# Patient Record
Sex: Male | Born: 1965 | Race: White | Hispanic: No | Marital: Single | Smoking: Never smoker
Health system: Southern US, Community
[De-identification: ages and names within clinical notes are randomized; demographics above are authoritative.]

## PROBLEM LIST (undated history)

## (undated) DIAGNOSIS — I219 Acute myocardial infarction, unspecified: Secondary | ICD-10-CM

## (undated) DIAGNOSIS — G43909 Migraine, unspecified, not intractable, without status migrainosus: Secondary | ICD-10-CM

## (undated) DIAGNOSIS — I10 Essential (primary) hypertension: Secondary | ICD-10-CM

## (undated) HISTORY — PX: CARDIAC CATHETERIZATION: SHX172

---

## 2015-03-04 ENCOUNTER — Encounter (HOSPITAL_COMMUNITY): Payer: Self-pay | Admitting: Emergency Medicine

## 2015-03-04 ENCOUNTER — Inpatient Hospital Stay (HOSPITAL_COMMUNITY)
Admission: EM | Admit: 2015-03-04 | Discharge: 2015-03-07 | DRG: 247 | Disposition: A | Discharge status: Home / Self Care | Payer: 59 | Attending: Cardiovascular Disease | Admitting: Cardiovascular Disease

## 2015-03-04 ENCOUNTER — Encounter (HOSPITAL_COMMUNITY)
Admission: EM | Disposition: A | Discharge status: Home / Self Care | Payer: Self-pay | Source: Home / Self Care | Attending: Cardiovascular Disease

## 2015-03-04 ENCOUNTER — Ambulatory Visit (HOSPITAL_COMMUNITY): Admit: 2015-03-04 | Payer: Self-pay | Admitting: Cardiovascular Disease

## 2015-03-04 DIAGNOSIS — E785 Hyperlipidemia, unspecified: Secondary | ICD-10-CM | POA: Diagnosis present

## 2015-03-04 DIAGNOSIS — Z955 Presence of coronary angioplasty implant and graft: Secondary | ICD-10-CM

## 2015-03-04 DIAGNOSIS — I10 Essential (primary) hypertension: Secondary | ICD-10-CM | POA: Diagnosis present

## 2015-03-04 DIAGNOSIS — R001 Bradycardia, unspecified: Secondary | ICD-10-CM | POA: Diagnosis not present

## 2015-03-04 DIAGNOSIS — I959 Hypotension, unspecified: Secondary | ICD-10-CM | POA: Diagnosis not present

## 2015-03-04 DIAGNOSIS — I25110 Atherosclerotic heart disease of native coronary artery with unstable angina pectoris: Secondary | ICD-10-CM | POA: Diagnosis present

## 2015-03-04 DIAGNOSIS — I213 ST elevation (STEMI) myocardial infarction of unspecified site: Secondary | ICD-10-CM

## 2015-03-04 DIAGNOSIS — I2119 ST elevation (STEMI) myocardial infarction involving other coronary artery of inferior wall: Secondary | ICD-10-CM | POA: Diagnosis present

## 2015-03-04 DIAGNOSIS — I219 Acute myocardial infarction, unspecified: Secondary | ICD-10-CM

## 2015-03-04 DIAGNOSIS — I2111 ST elevation (STEMI) myocardial infarction involving right coronary artery: Principal | ICD-10-CM | POA: Diagnosis present

## 2015-03-04 DIAGNOSIS — R079 Chest pain, unspecified: Secondary | ICD-10-CM | POA: Diagnosis present

## 2015-03-04 DIAGNOSIS — I2510 Atherosclerotic heart disease of native coronary artery without angina pectoris: Secondary | ICD-10-CM | POA: Diagnosis not present

## 2015-03-04 DIAGNOSIS — I2582 Chronic total occlusion of coronary artery: Secondary | ICD-10-CM | POA: Diagnosis present

## 2015-03-04 DIAGNOSIS — I200 Unstable angina: Secondary | ICD-10-CM | POA: Diagnosis present

## 2015-03-04 HISTORY — PX: CARDIAC CATHETERIZATION: SHX172

## 2015-03-04 HISTORY — DX: Acute myocardial infarction, unspecified: I21.9

## 2015-03-04 HISTORY — DX: Essential (primary) hypertension: I10

## 2015-03-04 HISTORY — DX: Migraine, unspecified, not intractable, without status migrainosus: G43.909

## 2015-03-04 LAB — I-STAT CHEM 8, ED
BUN: 15 mg/dL (ref 6–20)
Calcium, Ion: 1.07 mmol/L — ABNORMAL LOW (ref 1.12–1.23)
Chloride: 101 mmol/L (ref 101–111)
Creatinine, Ser: 1.1 mg/dL (ref 0.61–1.24)
Glucose, Bld: 134 mg/dL — ABNORMAL HIGH (ref 65–99)
HCT: 45 % (ref 39.0–52.0)
Hemoglobin: 15.3 g/dL (ref 13.0–17.0)
Potassium: 3.8 mmol/L (ref 3.5–5.1)
Sodium: 137 mmol/L (ref 135–145)
TCO2: 21 mmol/L (ref 0–100)

## 2015-03-04 LAB — CBC
HCT: 40.4 % (ref 39.0–52.0)
Hemoglobin: 13.5 g/dL (ref 13.0–17.0)
MCH: 27.7 pg (ref 26.0–34.0)
MCHC: 33.4 g/dL (ref 30.0–36.0)
MCV: 83 fL (ref 78.0–100.0)
Platelets: 439 10*3/uL — ABNORMAL HIGH (ref 150–400)
RBC: 4.87 MIL/uL (ref 4.22–5.81)
RDW: 14.2 % (ref 11.5–15.5)
WBC: 15.1 10*3/uL — ABNORMAL HIGH (ref 4.0–10.5)

## 2015-03-04 LAB — DIFFERENTIAL
Basophils Absolute: 0 10*3/uL (ref 0.0–0.1)
Basophils Relative: 0 %
Eosinophils Absolute: 0.2 10*3/uL (ref 0.0–0.7)
Eosinophils Relative: 2 %
Lymphocytes Relative: 21 %
Lymphs Abs: 3.1 10*3/uL (ref 0.7–4.0)
Monocytes Absolute: 0.9 10*3/uL (ref 0.1–1.0)
Monocytes Relative: 6 %
Neutro Abs: 10.9 10*3/uL — ABNORMAL HIGH (ref 1.7–7.7)
Neutrophils Relative %: 71 %

## 2015-03-04 LAB — BASIC METABOLIC PANEL
Anion gap: 11 (ref 5–15)
BUN: 12 mg/dL (ref 6–20)
CO2: 23 mmol/L (ref 22–32)
Calcium: 9.1 mg/dL (ref 8.9–10.3)
Chloride: 98 mmol/L — ABNORMAL LOW (ref 101–111)
Creatinine, Ser: 1.12 mg/dL (ref 0.61–1.24)
GFR calc Af Amer: >60 mL/min (ref >60)
GFR calc non Af Amer: >60 mL/min (ref >60)
GLUCOSE: 133 mg/dL — AB (ref 65–99)
POTASSIUM: 3.8 mmol/L (ref 3.5–5.1)
Sodium: 132 mmol/L — ABNORMAL LOW (ref 135–145)

## 2015-03-04 LAB — PROTIME-INR
INR: 1.15 (ref 0.00–1.49)
Prothrombin Time: 14.9 seconds (ref 11.6–15.2)

## 2015-03-04 LAB — I-STAT TROPONIN, ED: Troponin i, poc: 0.02 ng/mL (ref 0.00–0.08)

## 2015-03-04 LAB — APTT: aPTT: 27 seconds (ref 24–37)

## 2015-03-04 SURGERY — LEFT HEART CATH AND CORONARY ANGIOGRAPHY

## 2015-03-04 MED ORDER — HEPARIN SODIUM (PORCINE) 5000 UNIT/ML IJ SOLN
5000.0000 [IU] | Freq: Three times a day (TID) | INTRAMUSCULAR | Status: DC
  Administered 2015-03-05 – 2015-03-06 (×5): 5000 [IU] via SUBCUTANEOUS
  Filled 2015-03-04 (×6): qty 1

## 2015-03-04 MED ORDER — SODIUM CHLORIDE 0.9 % IJ SOLN
3.0000 mL | INTRAMUSCULAR | Status: DC | PRN
  Administered 2015-03-05: 3 mL via INTRAVENOUS
  Filled 2015-03-04: qty 3

## 2015-03-04 MED ORDER — ATROPINE SULFATE 0.1 MG/ML IJ SOLN
INTRAMUSCULAR | Status: AC
  Filled 2015-03-04: qty 10

## 2015-03-04 MED ORDER — TICAGRELOR 90 MG PO TABS
ORAL_TABLET | ORAL | Status: AC
  Filled 2015-03-04: qty 2

## 2015-03-04 MED ORDER — METOPROLOL TARTRATE 12.5 MG HALF TABLET
12.5000 mg | ORAL_TABLET | Freq: Two times a day (BID) | ORAL | Status: DC
  Administered 2015-03-04 – 2015-03-07 (×6): 12.5 mg via ORAL
  Filled 2015-03-04 (×6): qty 1

## 2015-03-04 MED ORDER — LIDOCAINE HCL (PF) 1 % IJ SOLN
INTRAMUSCULAR | Status: DC | PRN
  Administered 2015-03-04: 22:00:00

## 2015-03-04 MED ORDER — BIVALIRUDIN 250 MG IV SOLR
INTRAVENOUS | Status: AC
  Filled 2015-03-04: qty 250

## 2015-03-04 MED ORDER — ASPIRIN EC 81 MG PO TBEC
81.0000 mg | DELAYED_RELEASE_TABLET | Freq: Every day | ORAL | Status: DC
  Administered 2015-03-05 – 2015-03-07 (×3): 81 mg via ORAL
  Filled 2015-03-04 (×3): qty 1

## 2015-03-04 MED ORDER — TICAGRELOR 90 MG PO TABS
ORAL_TABLET | ORAL | Status: DC | PRN
  Administered 2015-03-04: 180 mg via ORAL

## 2015-03-04 MED ORDER — ASPIRIN 81 MG PO CHEW
324.0000 mg | CHEWABLE_TABLET | Freq: Once | ORAL | Status: DC
  Filled 2015-03-04: qty 4

## 2015-03-04 MED ORDER — HEPARIN SODIUM (PORCINE) 5000 UNIT/ML IJ SOLN
60.0000 [IU]/kg | Freq: Once | INTRAMUSCULAR | Status: AC
  Administered 2015-03-04: 5000 [IU] via INTRAVENOUS

## 2015-03-04 MED ORDER — SODIUM CHLORIDE 0.9 % IV SOLN
INTRAVENOUS | Status: AC
Start: 2015-03-04 — End: 2015-03-05
  Administered 2015-03-04: 23:00:00 via INTRAVENOUS

## 2015-03-04 MED ORDER — BIVALIRUDIN BOLUS VIA INFUSION - CUPID
INTRAVENOUS | Status: DC | PRN
  Administered 2015-03-04: 73.125 mg via INTRAVENOUS

## 2015-03-04 MED ORDER — ATORVASTATIN CALCIUM 80 MG PO TABS
80.0000 mg | ORAL_TABLET | Freq: Every day | ORAL | Status: DC
  Administered 2015-03-04 – 2015-03-06 (×3): 80 mg via ORAL
  Filled 2015-03-04 (×3): qty 1

## 2015-03-04 MED ORDER — ATROPINE SULFATE 0.1 MG/ML IJ SOLN
INTRAMUSCULAR | Status: DC | PRN
  Administered 2015-03-04: 1 mg via INTRAVENOUS

## 2015-03-04 MED ORDER — HEPARIN SODIUM (PORCINE) 5000 UNIT/ML IJ SOLN
INTRAMUSCULAR | Status: AC
Start: 2015-03-04 — End: 2015-03-04
  Administered 2015-03-04: 5000 [IU]
  Filled 2015-03-04: qty 1

## 2015-03-04 MED ORDER — FENTANYL CITRATE (PF) 100 MCG/2ML IJ SOLN
INTRAMUSCULAR | Status: AC
  Filled 2015-03-04: qty 4

## 2015-03-04 MED ORDER — ONDANSETRON HCL 4 MG/2ML IJ SOLN
4.0000 mg | Freq: Four times a day (QID) | INTRAMUSCULAR | Status: DC | PRN
  Administered 2015-03-06 (×2): 4 mg via INTRAVENOUS
  Filled 2015-03-04 (×2): qty 2

## 2015-03-04 MED ORDER — FENTANYL CITRATE (PF) 100 MCG/2ML IJ SOLN
INTRAMUSCULAR | Status: DC | PRN
  Administered 2015-03-04: 50 ug via INTRAVENOUS

## 2015-03-04 MED ORDER — SODIUM CHLORIDE 0.9 % IJ SOLN: 3.0000 mL | Freq: Two times a day (BID) | INTRAMUSCULAR | Status: DC

## 2015-03-04 MED ORDER — MIDAZOLAM HCL 2 MG/2ML IJ SOLN
INTRAMUSCULAR | Status: AC
  Filled 2015-03-04: qty 4

## 2015-03-04 MED ORDER — MIDAZOLAM HCL 2 MG/2ML IJ SOLN
INTRAMUSCULAR | Status: DC | PRN
  Administered 2015-03-04: 1 mg via INTRAVENOUS
  Administered 2015-03-04: 2 mg via INTRAVENOUS

## 2015-03-04 MED ORDER — BIVALIRUDIN 250 MG IV SOLR
250.0000 mg | INTRAVENOUS | Status: DC | PRN
  Administered 2015-03-04: 1.75 mg/kg/h via INTRAVENOUS

## 2015-03-04 MED ORDER — FENTANYL CITRATE (PF) 100 MCG/2ML IJ SOLN: 50.0000 ug | Freq: Once | INTRAMUSCULAR | Status: DC

## 2015-03-04 MED ORDER — SODIUM CHLORIDE 0.9 % IJ SOLN
3.0000 mL | Freq: Two times a day (BID) | INTRAMUSCULAR | Status: DC
  Administered 2015-03-05 (×2): 3 mL via INTRAVENOUS

## 2015-03-04 MED ORDER — TICAGRELOR 90 MG PO TABS
90.0000 mg | ORAL_TABLET | Freq: Two times a day (BID) | ORAL | Status: DC
  Administered 2015-03-05 – 2015-03-07 (×5): 90 mg via ORAL
  Filled 2015-03-04 (×5): qty 1

## 2015-03-04 MED ORDER — LIDOCAINE HCL (PF) 1 % IJ SOLN
INTRAMUSCULAR | Status: AC
  Filled 2015-03-04: qty 30

## 2015-03-04 MED ORDER — VERAPAMIL HCL 2.5 MG/ML IV SOLN
INTRAVENOUS | Status: AC
  Filled 2015-03-04: qty 2

## 2015-03-04 MED ORDER — INFLUENZA VAC SPLIT QUAD 0.5 ML IM SUSY
0.5000 mL | PREFILLED_SYRINGE | INTRAMUSCULAR | Status: AC
  Administered 2015-03-05: 0.5 mL via INTRAMUSCULAR
  Filled 2015-03-04: qty 0.5

## 2015-03-04 MED ORDER — VERAPAMIL HCL 2.5 MG/ML IV SOLN
INTRAVENOUS | Status: DC | PRN
  Administered 2015-03-04: 21:00:00 via INTRA_ARTERIAL

## 2015-03-04 MED ORDER — NITROGLYCERIN 0.4 MG SL SUBL: 0.4000 mg | SUBLINGUAL_TABLET | SUBLINGUAL | Status: DC | PRN

## 2015-03-04 MED ORDER — HEPARIN (PORCINE) IN NACL 2-0.9 UNIT/ML-% IJ SOLN
INTRAMUSCULAR | Status: AC
  Filled 2015-03-04: qty 1500

## 2015-03-04 MED ORDER — ACETAMINOPHEN 325 MG PO TABS: 650.0000 mg | ORAL_TABLET | ORAL | Status: DC | PRN

## 2015-03-04 MED ORDER — SODIUM CHLORIDE 0.9 % IV SOLN: 250.0000 mL | INTRAVENOUS | Status: DC | PRN

## 2015-03-04 MED ORDER — OXYCODONE-ACETAMINOPHEN 5-325 MG PO TABS
1.0000 | ORAL_TABLET | ORAL | Status: DC | PRN
  Administered 2015-03-05: 2 via ORAL
  Administered 2015-03-05 (×2): 1 via ORAL
  Administered 2015-03-06: 13:00:00 2 via ORAL
  Filled 2015-03-04 (×3): qty 1
  Filled 2015-03-04 (×2): qty 2

## 2015-03-04 SURGICAL SUPPLY — 24 items
BALLN EMERGE MR 2.5X12 (BALLOONS) ×3
BALLN ~~LOC~~ EUPHORA RX 4.5X8 (BALLOONS) ×3
BALLOON EMERGE MR 2.5X12 (BALLOONS) ×1 IMPLANT
BALLOON ~~LOC~~ EUPHORA RX 4.5X8 (BALLOONS) ×1 IMPLANT
CATH INFINITI 5 FR JL3.5 (CATHETERS) ×3 IMPLANT
CATH INFINITI 5FR ANG PIGTAIL (CATHETERS) ×3 IMPLANT
CATH INFINITI JR4 5F (CATHETERS) ×3 IMPLANT
DEVICE CLOSURE PERCLS PRGLD 6F (VASCULAR PRODUCTS) ×1 IMPLANT
DEVICE RAD COMP TR BAND LRG (VASCULAR PRODUCTS) ×3 IMPLANT
GLIDESHEATH SLEND SS 6F .021 (SHEATH) ×3 IMPLANT
GUIDE CATH RUNWAY 6FR FR4 (CATHETERS) ×3 IMPLANT
KIT ENCORE 26 ADVANTAGE (KITS) ×3 IMPLANT
KIT HEART LEFT (KITS) ×3 IMPLANT
PACK CARDIAC CATHETERIZATION (CUSTOM PROCEDURE TRAY) ×3 IMPLANT
PERCLOSE PROGLIDE 6F (VASCULAR PRODUCTS) ×3
SHEATH PINNACLE 6F 10CM (SHEATH) ×3 IMPLANT
STENT VISION RX 4.0X12 (Permanent Stent) ×3 IMPLANT
SYR MEDRAD MARK V 150ML (SYRINGE) ×3 IMPLANT
TRANSDUCER W/STOPCOCK (MISCELLANEOUS) ×3 IMPLANT
TUBING CIL FLEX 10 FLL-RA (TUBING) ×3 IMPLANT
WIRE COUGAR XT STRL 190CM (WIRE) ×6 IMPLANT
WIRE EMERALD 3MM-J .035X150CM (WIRE) ×6 IMPLANT
WIRE HI TORQ VERSACORE-J 145CM (WIRE) ×3 IMPLANT
WIRE SAFE-T 1.5MM-J .035X260CM (WIRE) ×3 IMPLANT

## 2015-03-04 NOTE — Interval H&P Note (Signed)
Cath Lab Visit (complete for each Cath Lab visit)  Clinical Evaluation Leading to the Procedure:   ACS: Yes.    Non-ACS:    Anginal Classification: CCS IV  Anti-ischemic medical therapy: No Therapy  Non-Invasive Test Results: No non-invasive testing performed  Prior CABG: No previous CABG  49 yo male with inferior STEMI. Emergency cath/PCI indicated.    History and Physical Interval Note:  03/04/2015 9:16 PM  Justin Collins  has presented today for surgery, with the diagnosis of stemi  The various methods of treatment have been discussed with the patient and family. After consideration of risks, benefits and other options for treatment, the patient has consented to  Procedure(s): Left Heart Cath and Coronary Angiography (N/A) Coronary Stent Intervention (N/A) as a surgical intervention .  The patient's history has been reviewed, patient examined, no change in status, stable for surgery.  I have reviewed the patient's chart and labs.  Questions were answered to the patient's satisfaction.     Tonny Bollman

## 2015-03-04 NOTE — ED Notes (Signed)
Pt in EMS from work, chest pressure since 7pm, only hx HTN. Given 4 morphine, 4 ASA, 1 NTG en route

## 2015-03-04 NOTE — ED Notes (Signed)
Pt being transferred to cath lab with Tori RN, Izetta Dakin, Cooper MD. Pt stable leaving floor, belonging with pt and will be left with staff at cath lab, pt completely undressed, 2 IV in place

## 2015-03-04 NOTE — H&P (Signed)
C. C chest pain  HPI: 49 y/o man with h/o HTN on lisinopril no previous CAD came to ED by EMS for chest pain that started 2 hours ago and EKG showed inferior STE. He received ASA and morphine. CP left side pressure like started 2 hours ago. No h/o tia/stroke, no bleeding issues Compliant with meds Non smoker No premature family h/o CAD Vitals stable,    Review of Systems:     Cardiac Review of Systems: {Y] = yes [ ]  = no  Chest Pain [  y  ]  Resting SOB [   ] Exertional SOB  [  ]  Orthopnea [  ]   Pedal Edema [   ]    Palpitations [  ] Syncope  [  ]   Presyncope [   ]  General Review of Systems: [Y] = yes [  ]=no Constitional: recent weight change [  ]; anorexia [  ]; fatigue [  ]; nausea [  ]; night sweats [  ]; fever [  ]; or chills [  ];                                                                     Dental: poor dentition[  ];   Eye : blurred vision [  ]; diplopia [   ]; vision changes [  ];  Amaurosis fugax[  ]; Resp: cough [  ];  wheezing[  ];  hemoptysis[  ]; shortness of breath[  ]; paroxysmal nocturnal dyspnea[  ]; dyspnea on exertion[  ]; or orthopnea[  ];  GI:  gallstones[  ], vomiting[  ];  dysphagia[  ]; melena[  ];  hematochezia [  ]; heartburn[  ];   GU: kidney stones [  ]; hematuria[  ];   dysuria [  ];  nocturia[  ];               Skin: rash [  ], swelling[  ];, hair loss[  ];  peripheral edema[  ];  or itching[  ]; Musculosketetal: myalgias[  ];  joint swelling[  ];  joint erythema[  ];  joint pain[  ];  back pain[  ];  Heme/Lymph: bruising[  ];  bleeding[  ];  anemia[  ];  Neuro: TIA[  ];  headaches[  ];  stroke[  ];  vertigo[  ];  seizures[  ];   paresthesias[  ];  difficulty walking[  ];    Other:  Past Medical History  Diagnosis Date  . Hypertension     tamadol lisinopril   No Known Allergies  Social History   Social History  . Marital Status: N/A    Spouse Name: N/A  . Number of Children: N/A  . Years of Education: N/A   Occupational  History  . Not on file.   Social History Main Topics  . Smoking status: Not on file  . Smokeless tobacco: Not on file  . Alcohol Use: Not on file  . Drug Use: Not on file  . Sexual Activity: Not on file   Other Topics Concern  . Not on file   Social History Narrative  . No narrative on file    History reviewed. No pertinent family history.  PHYSICAL EXAM: There were no vitals filed for this visit. General:  Well appearing. No respiratory difficulty, cold sweaty HEENT: normal Neck: supple. no JVD. Carotids 2+ bilat; Cor: PMI nondisplaced. Regular rate & rhythm. No rubs, gallops or murmurs. Lungs: clear Abdomen: soft, nontender, nondistended. No hepatosplenomegaly. No bruits or masses. Good bowel sounds. Extremities: no cyanosis, clubbing, rash, edema Neuro: alert & oriented x 3, cranial nerves grossly intact. moves all 4 extremities w/o difficulty. Affect pleasant.  ECG:  Sinus brady Inferior STEMI   ASSESSMENT:  STEMI Inferior HTN   PLAN/DISCUSSION: 1 ASA 325 x 1 and continue 81 mg daily 2. High intensity statins 3. Heparin drip  5. Cath lab for primary PCI 6. Guideline directed medical therapy, bb, statins, acei  7. Echo in am   Donavon Kimrey Alonna Minium

## 2015-03-04 NOTE — ED Provider Notes (Signed)
CSN: [id number]     Arrival date & time    History   First MD Initiated Contact with Patient 03/04/15 2049     Chief Complaint  Patient presents with  . Code STEMI     (Consider location/radiation/quality/duration/timing/severity/associated sxs/prior Treatment) HPI 49 year old male who presents with chest pain. History of hypertension. Reports onset of chest pressure and diaphoresis while driving at 7:03 PM. Subsequently called EMS. Received morphine, nitroglycerin, and 4 baby aspirins prior to arrival. Pain radiating to left shoulder and associated with nausea. Denies vomiting or difficulty breathing. No prior cardiac history. EMS EKG concerning for STEMI and cath lab activated prior to arrival.   Past Medical History  Diagnosis Date  . Hypertension    History reviewed. No pertinent past surgical history. History reviewed. No pertinent family history. Social History  Substance Use Topics  . Smoking status: None  . Smokeless tobacco: None  . Alcohol Use: None    Review of Systems  Unable to perform ROS: Acuity of condition      Allergies  Review of patient's allergies indicates no known allergies.  Home Medications   Prior to Admission medications   Not on File   Ht 5\' 6"  (1.676 m)  Wt 215 lb (97.523 kg)  BMI 34.72 kg/m2 Physical Exam Physical Exam  Nursing note and vitals reviewed. Constitutional: Pale, appears unwell,  non-toxic, and in no acute distress Head: Normocephalic and atraumatic.  Mouth/Throat: Oropharynx is clear and moist.  Neck: Normal range of motion. Neck supple.  Cardiovascular: Normal rate and regular rhythm.  No edema Pulmonary/Chest: Effort normal and breath sounds normal.  Abdominal: Soft. Obese. Non-tender.  Musculoskeletal: Normal range of motion.  Neurological: Alert, no facial droop, fluent speech, moves all extremities symmetrically Skin: Skin is warm and dry.  Psychiatric: Cooperative   ED Course  Procedures (including critical  care time) Labs Review Labs Reviewed  CBC - Abnormal; Notable for the following:    WBC 15.1 (*)    Platelets 439 (*)    All other components within normal limits  DIFFERENTIAL - Abnormal; Notable for the following:    Neutro Abs 10.9 (*)    All other components within normal limits  I-STAT CHEM 8, ED - Abnormal; Notable for the following:    Glucose, Bld 134 (*)    Calcium, Ion 1.07 (*)    All other components within normal limits  PROTIME-INR  APTT  BASIC METABOLIC PANEL  I-STAT TROPOININ, ED    I have personally reviewed and evaluated these images and lab results as part of my medical decision-making.   EKG Interpretation   Date/Time:  Saturday March 04 2015 21:01:10 EDT Ventricular Rate:  57 PR Interval:  157 QRS Duration: 79 QT Interval:  420 QTC Calculation: 409 R Axis:   68 Text Interpretation:  Sinus rhythm ** ** ACUTE MI / STEMI ** ** Inferior  MI wtih ST depression in anterior precordial and lateral limb leads  Confirmed by Hammad Finkler MD, Annabelle Harman ([id number]) on 03/04/2015 9:13:35 PM      MDM   Final diagnoses:  ST elevation myocardial infarction (STEMI), unspecified artery (HCC)    49 year old who presents with inferior STEMI. Appears pale and unwell on arrival but in no acute distress. Continues to have 6 out of 10 chest pain. Hemodynamically stable and cardiopulmonary exam otherwise unremarkable. Cardiology present at time of patient arrival to discuss a left heart catheterization and PCI. Given bolus of heparin. Transferred to catheterization lab in stable condition.  CRITICAL  CARE Performed by: Lavera Guise   Total critical care time: 30 min  Critical care time was exclusive of separately billable procedures and treating other patients.  Critical care was necessary to treat or prevent imminent or life-threatening deterioration.  Critical care was time spent personally by me on the following activities: development of treatment plan with patient and/or  surrogate as well as nursing, discussions with consultants, evaluation of patient's response to treatment, examination of patient, obtaining history from patient or surrogate, ordering and performing treatments and interventions, ordering and review of laboratory studies, ordering and review of radiographic studies, pulse oximetry and re-evaluation of patient's condition.   Lavera Guise, MD 03/04/15 2115

## 2015-03-05 DIAGNOSIS — I2119 ST elevation (STEMI) myocardial infarction involving other coronary artery of inferior wall: Secondary | ICD-10-CM

## 2015-03-05 DIAGNOSIS — I10 Essential (primary) hypertension: Secondary | ICD-10-CM

## 2015-03-05 DIAGNOSIS — I25110 Atherosclerotic heart disease of native coronary artery with unstable angina pectoris: Secondary | ICD-10-CM

## 2015-03-05 DIAGNOSIS — I2510 Atherosclerotic heart disease of native coronary artery without angina pectoris: Secondary | ICD-10-CM | POA: Diagnosis not present

## 2015-03-05 LAB — LIPID PANEL
Cholesterol: 207 mg/dL — ABNORMAL HIGH (ref 0–200)
HDL: 33 mg/dL — ABNORMAL LOW (ref >40)
LDL CALC: 136 mg/dL — AB (ref 0–99)
Total CHOL/HDL Ratio: 6.3 RATIO
Triglycerides: 191 mg/dL — ABNORMAL HIGH (ref <150)
VLDL: 38 mg/dL (ref 0–40)

## 2015-03-05 LAB — MRSA PCR SCREENING: MRSA BY PCR: NEGATIVE

## 2015-03-05 LAB — CBC
HCT: 37.3 % — ABNORMAL LOW (ref 39.0–52.0)
Hemoglobin: 12.7 g/dL — ABNORMAL LOW (ref 13.0–17.0)
MCH: 28 pg (ref 26.0–34.0)
MCHC: 34 g/dL (ref 30.0–36.0)
MCV: 82.3 fL (ref 78.0–100.0)
PLATELETS: 440 10*3/uL — AB (ref 150–400)
RBC: 4.53 MIL/uL (ref 4.22–5.81)
RDW: 14.4 % (ref 11.5–15.5)
WBC: 10.2 10*3/uL (ref 4.0–10.5)

## 2015-03-05 LAB — BASIC METABOLIC PANEL
Anion gap: 9 (ref 5–15)
BUN: 10 mg/dL (ref 6–20)
CALCIUM: 8.5 mg/dL — AB (ref 8.9–10.3)
CHLORIDE: 101 mmol/L (ref 101–111)
CO2: 22 mmol/L (ref 22–32)
CREATININE: 0.89 mg/dL (ref 0.61–1.24)
GFR calc Af Amer: >60 mL/min (ref >60)
GFR calc non Af Amer: >60 mL/min (ref >60)
Glucose, Bld: 101 mg/dL — ABNORMAL HIGH (ref 65–99)
Potassium: 3.8 mmol/L (ref 3.5–5.1)
SODIUM: 132 mmol/L — AB (ref 135–145)

## 2015-03-05 LAB — TROPONIN I
TROPONIN I: 1.14 ng/mL — AB (ref <0.031)
TROPONIN I: 7.94 ng/mL — AB (ref <0.031)
Troponin I: 7.08 ng/mL (ref <0.031)

## 2015-03-05 MED ORDER — ASPIRIN 81 MG PO CHEW
81.0000 mg | CHEWABLE_TABLET | ORAL | Status: AC
  Administered 2015-03-06: 81 mg via ORAL
  Filled 2015-03-05: qty 1

## 2015-03-05 MED ORDER — SODIUM CHLORIDE 0.9 % IJ SOLN
3.0000 mL | Freq: Two times a day (BID) | INTRAMUSCULAR | Status: DC
Start: 2015-03-05 — End: 2015-03-06
  Administered 2015-03-05 – 2015-03-06 (×2): 3 mL via INTRAVENOUS

## 2015-03-05 MED ORDER — SODIUM CHLORIDE 0.9 % WEIGHT BASED INFUSION
1.0000 mL/kg/h | INTRAVENOUS | Status: DC
  Administered 2015-03-06: 1 mL/kg/h via INTRAVENOUS

## 2015-03-05 MED ORDER — SODIUM CHLORIDE 0.9 % IJ SOLN: 3.0000 mL | INTRAMUSCULAR | Status: DC | PRN

## 2015-03-05 MED ORDER — SODIUM CHLORIDE 0.9 % WEIGHT BASED INFUSION
3.0000 mL/kg/h | INTRAVENOUS | Status: DC
  Administered 2015-03-06: 3 mL/kg/h via INTRAVENOUS

## 2015-03-05 MED ORDER — SODIUM CHLORIDE 0.9 % IV SOLN: 250.0000 mL | INTRAVENOUS | Status: DC | PRN

## 2015-03-05 NOTE — Progress Notes (Addendum)
Utilization Review Completed.Dennis Killilea T10/07/2014  

## 2015-03-05 NOTE — Progress Notes (Signed)
   SUBJECTIVE:  Still has some residual mild CP  OBJECTIVE:   Vitals:   Filed Vitals:   03/05/15 0700 03/05/15 0800 03/05/15 0900 03/05/15 1000  BP: 116/79 118/77 117/75 120/80  Pulse: 56 65 64 58  Temp:  98.2 F (36.8 C)    TempSrc:  Oral    Resp: 17 13 17 13   Height:      Weight:      SpO2: 96% 99% 97% 98%   I&O's:   Intake/Output Summary (Last 24 hours) at 03/05/15 1050 Last data filed at 03/05/15 1000  Gross per 24 hour  Intake 738.34 ml  Output   1000 ml  Net -261.66 ml   TELEMETRY: Reviewed telemetry pt in NSR:     PHYSICAL EXAM General: Well developed, well nourished, in no acute distress Head: Eyes PERRLA, No xanthomas.   Normal cephalic and atramatic  Lungs:   Clear bilaterally to auscultation and percussion. Heart:   HRRR S1 S2 Pulses are 2+ & equal. Abdomen: Bowel sounds are positive, abdomen soft and non-tender without masses Extremities:   No clubbing, cyanosis or edema.  DP +1 Neuro: Alert and oriented X 3. Psych:  Good affect, responds appropriately   LABS: Basic Metabolic Panel:  Recent Labs  90/24/09 2103 03/04/15 2108 03/05/15 0430  NA 132* 137 132*  K 3.8 3.8 3.8  CL 98* 101 101  CO2 23  --  22  GLUCOSE 133* 134* 101*  BUN 12 15 10   CREATININE 1.12 1.10 0.89  CALCIUM 9.1  --  8.5*   Liver Function Tests: No results for input(s): AST, ALT, ALKPHOS, BILITOT, PROT, ALBUMIN in the last 72 hours. No results for input(s): LIPASE, AMYLASE in the last 72 hours. CBC:  Recent Labs  03/04/15 2103 03/04/15 2108 03/05/15 0430  WBC 15.1*  --  10.2  NEUTROABS 10.9*  --   --   HGB 13.5 15.3 12.7*  HCT 40.4 45.0 37.3*  MCV 83.0  --  82.3  PLT 439*  --  440*   Cardiac Enzymes:  Recent Labs  03/04/15 2358 03/05/15 0430  TROPONINI 1.14* 7.94*   BNP: Invalid input(s): POCBNP D-Dimer: No results for input(s): DDIMER in the last 72 hours. Hemoglobin A1C: No results for input(s): HGBA1C in the last 72 hours. Fasting Lipid  Panel:  Recent Labs  03/05/15 0430  CHOL 207*  HDL 33*  LDLCALC 136*  TRIG 191*  CHOLHDL 6.3   Thyroid Function Tests: No results for input(s): TSH, T4TOTAL, T3FREE, THYROIDAB in the last 72 hours.  Invalid input(s): FREET3 Anemia Panel: No results for input(s): VITAMINB12, FOLATE, FERRITIN, TIBC, IRON, RETICCTPCT in the last 72 hours. Coag Panel:   Lab Results  Component Value Date   INR 1.15 03/04/2015    RADIOLOGY: No results found.    ASSESSMENT/PLAN: 1.  Acute Inferior STEMI - s/p cath with 50% distal LAD, 70% distal LAD, 50% ostial LCx, occluded prox RCA, 30% LM, 75% prox LAD s/p PCI of the prox RCA with BMS. EF preserved.   Troponin trending downward.  He has residual severe prox and distal LAD stenosis so will plan staged PCI of the prox LAD tomorrow.  Continue ASA/Brilinta/statin/BB.  Check HbA1C. 2.  Dyslipidemia - LDL 136 - started on high does statin 3.  HTN - controlled on BB 4.  DVT prophylaxis with SQ Heparin  Quintella Reichert, MD  03/05/2015  10:50 AM

## 2015-03-05 NOTE — Progress Notes (Signed)
03/05/2015 1:40 AM Critical Trop 1.14. Dr. Excell Seltzer aware. Edna Grover, Linnell Fulling

## 2015-03-06 ENCOUNTER — Encounter (HOSPITAL_COMMUNITY)
Admission: EM | Disposition: A | Discharge status: Home / Self Care | Payer: Self-pay | Source: Home / Self Care | Attending: Cardiovascular Disease

## 2015-03-06 ENCOUNTER — Encounter (HOSPITAL_COMMUNITY): Payer: Self-pay | Admitting: Cardiovascular Disease

## 2015-03-06 DIAGNOSIS — I200 Unstable angina: Secondary | ICD-10-CM | POA: Diagnosis present

## 2015-03-06 HISTORY — PX: CARDIAC CATHETERIZATION: SHX172

## 2015-03-06 LAB — POCT ACTIVATED CLOTTING TIME
ACTIVATED CLOTTING TIME: 380 s
Activated Clotting Time: 583 seconds

## 2015-03-06 LAB — HEMOGLOBIN A1C
HEMOGLOBIN A1C: 6.2 % — AB (ref 4.8–5.6)
MEAN PLASMA GLUCOSE: 131 mg/dL

## 2015-03-06 SURGERY — CORONARY STENT INTERVENTION
Anesthesia: LOCAL

## 2015-03-06 MED ORDER — IOHEXOL 350 MG/ML SOLN
INTRAVENOUS | Status: DC | PRN
  Administered 2015-03-06: 125 mL via INTRAVENOUS

## 2015-03-06 MED ORDER — SODIUM CHLORIDE 0.9 % IV SOLN
250.0000 mg | INTRAVENOUS | Status: DC | PRN
  Administered 2015-03-06: 1.75 mg/kg/h via INTRAVENOUS

## 2015-03-06 MED ORDER — HEPARIN (PORCINE) IN NACL 2-0.9 UNIT/ML-% IJ SOLN
INTRAMUSCULAR | Status: AC
  Filled 2015-03-06: qty 1000

## 2015-03-06 MED ORDER — SODIUM CHLORIDE 0.9 % IJ SOLN: 3.0000 mL | INTRAMUSCULAR | Status: DC | PRN

## 2015-03-06 MED ORDER — LIDOCAINE HCL (PF) 1 % IJ SOLN
INTRAMUSCULAR | Status: AC
  Filled 2015-03-06: qty 30

## 2015-03-06 MED ORDER — FENTANYL CITRATE (PF) 100 MCG/2ML IJ SOLN
INTRAMUSCULAR | Status: AC
  Filled 2015-03-06: qty 4

## 2015-03-06 MED ORDER — SODIUM CHLORIDE 0.9 % IV SOLN: 250.0000 mL | INTRAVENOUS | Status: DC | PRN

## 2015-03-06 MED ORDER — ALUM & MAG HYDROXIDE-SIMETH 200-200-20 MG/5ML PO SUSP
30.0000 mL | ORAL | Status: DC | PRN
  Administered 2015-03-06: 30 mL via ORAL
  Filled 2015-03-06: qty 30

## 2015-03-06 MED ORDER — LIDOCAINE HCL (PF) 1 % IJ SOLN
INTRAMUSCULAR | Status: DC | PRN
  Administered 2015-03-06: 11:00:00

## 2015-03-06 MED ORDER — MIDAZOLAM HCL 2 MG/2ML IJ SOLN
INTRAMUSCULAR | Status: AC
  Filled 2015-03-06: qty 4

## 2015-03-06 MED ORDER — MIDAZOLAM HCL 2 MG/2ML IJ SOLN
INTRAMUSCULAR | Status: DC | PRN
  Administered 2015-03-06 (×2): 2 mg via INTRAVENOUS

## 2015-03-06 MED ORDER — SODIUM CHLORIDE 0.9 % IJ SOLN
3.0000 mL | Freq: Two times a day (BID) | INTRAMUSCULAR | Status: DC
  Administered 2015-03-06: 3 mL via INTRAVENOUS

## 2015-03-06 MED ORDER — LIDOCAINE HCL (PF) 1 % IJ SOLN
INTRAMUSCULAR | Status: DC | PRN
  Administered 2015-03-06: 3 mL
  Administered 2015-03-06: 15 mL

## 2015-03-06 MED ORDER — SODIUM CHLORIDE 0.9 % IV SOLN: INTRAVENOUS | Status: AC

## 2015-03-06 MED ORDER — DOCUSATE SODIUM 100 MG PO CAPS
100.0000 mg | ORAL_CAPSULE | Freq: Two times a day (BID) | ORAL | Status: DC | PRN
  Administered 2015-03-06: 14:00:00 100 mg via ORAL
  Filled 2015-03-06: qty 1

## 2015-03-06 MED ORDER — PANTOPRAZOLE SODIUM 40 MG PO TBEC
40.0000 mg | DELAYED_RELEASE_TABLET | Freq: Every day | ORAL | Status: DC
  Administered 2015-03-06 – 2015-03-07 (×2): 40 mg via ORAL
  Filled 2015-03-06 (×2): qty 1

## 2015-03-06 MED ORDER — BIVALIRUDIN 250 MG IV SOLR
INTRAVENOUS | Status: AC
  Filled 2015-03-06: qty 250

## 2015-03-06 MED ORDER — BIVALIRUDIN BOLUS VIA INFUSION - CUPID
INTRAVENOUS | Status: DC | PRN
  Administered 2015-03-06: 75.525 mg via INTRAVENOUS

## 2015-03-06 MED ORDER — NITROGLYCERIN 1 MG/10 ML FOR IR/CATH LAB
INTRA_ARTERIAL | Status: AC
  Filled 2015-03-06: qty 10

## 2015-03-06 MED ORDER — FENTANYL CITRATE (PF) 100 MCG/2ML IJ SOLN
INTRAMUSCULAR | Status: DC | PRN
  Administered 2015-03-06: 50 ug via INTRAVENOUS
  Administered 2015-03-06 (×2): 25 ug via INTRAVENOUS

## 2015-03-06 MED ORDER — VERAPAMIL HCL 2.5 MG/ML IV SOLN
INTRAVENOUS | Status: AC
  Filled 2015-03-06: qty 2

## 2015-03-06 MED ORDER — VERAPAMIL HCL 2.5 MG/ML IV SOLN
INTRAVENOUS | Status: DC | PRN
  Administered 2015-03-06: 11:00:00 via INTRA_ARTERIAL

## 2015-03-06 SURGICAL SUPPLY — 19 items
BALLN TREK RX 2.5X15 (BALLOONS) ×4
BALLN ~~LOC~~ EMERGE MR 3.25X12 (BALLOONS) ×2
BALLOON TREK RX 2.5X15 (BALLOONS) ×2 IMPLANT
BALLOON ~~LOC~~ EMERGE MR 3.25X12 (BALLOONS) ×1 IMPLANT
CATH VISTA GUIDE 6FR XBLAD3.5 (CATHETERS) ×2 IMPLANT
DEVICE RAD TR BAND REGULAR (VASCULAR PRODUCTS) ×2 IMPLANT
DEVICE WIRE ANGIOSEAL 6FR (Vascular Products) ×2 IMPLANT
GLIDESHEATH SLEND SS 6F .021 (SHEATH) ×2 IMPLANT
KIT ENCORE 26 ADVANTAGE (KITS) ×2 IMPLANT
KIT HEART LEFT (KITS) ×2 IMPLANT
PACK CARDIAC CATHETERIZATION (CUSTOM PROCEDURE TRAY) ×2 IMPLANT
SHEATH PINNACLE 6F 10CM (SHEATH) ×2 IMPLANT
STENT PROMUS PREM MR 3.0X16 (Permanent Stent) ×2 IMPLANT
TRANSDUCER W/STOPCOCK (MISCELLANEOUS) ×2 IMPLANT
TUBING CIL FLEX 10 FLL-RA (TUBING) ×2 IMPLANT
WIRE COUGAR XT STRL 190CM (WIRE) ×2 IMPLANT
WIRE EMERALD 3MM-J .035X260CM (WIRE) ×2 IMPLANT
WIRE HITORQ VERSACORE ST 145CM (WIRE) ×2 IMPLANT
WIRE SAFE-T 1.5MM-J .035X260CM (WIRE) ×2 IMPLANT

## 2015-03-06 NOTE — H&P (View-Only) (Signed)
Patient Name: Justin Collins Date of Encounter: 03/06/2015  Principal Problem:   STEMI (ST elevation myocardial infarction) (HCC) Active Problems:   Hypertension   Acute inferoposterior myocardial infarction (HCC)   CAD (coronary artery disease), native coronary artery   Length of Stay: 2  SUBJECTIVE: Feels well. No angina or dyspnea. Minimally tender right groin. Right wrist OK.   CATH 03/04/15  Dist LAD-1 lesion, 50% stenosed.  Dist LAD-2 lesion, 70% stenosed.  Ost Cx lesion, 50% stenosed.  Prox RCA lesion, 100% stenosed. There is a 0% residual stenosis post intervention.  A bare metal stent was placed.  LM lesion, 30% stenosed.  Prox LAD lesion, 75% stenosed.  1. Acute inferior wall MI with total occlusion of the proximal RCA treated successfully with primary PCI using a bare metal stent 2. Severe proximal LAD stenosis and diffuse distal LAD stenosis 3. Mild left main and proximal left circumflex stenosis 4. Preserved LV function with LVEF estimated at 60%  Recommend: Staged PCI of the proximal LAD, medical therapy for residual CAD. ASA, brilinta at least 12 months.   CURRENT MEDS . aspirin EC  81 mg Oral Daily  . atorvastatin  80 mg Oral q1800  . fentaNYL (SUBLIMAZE) injection  50 mcg Intravenous Once  . heparin  5,000 Units Subcutaneous 3 times per day  . metoprolol tartrate  12.5 mg Oral BID  . sodium chloride  3 mL Intravenous Q12H  . sodium chloride  3 mL Intravenous Q12H  . ticagrelor  90 mg Oral BID    OBJECTIVE   Intake/Output Summary (Last 24 hours) at 03/06/15 0845 Last data filed at 03/06/15 0600  Gross per 24 hour  Intake 705.32 ml  Output    950 ml  Net -244.68 ml   Filed Weights   03/04/15 2103 03/04/15 2300  Weight: 215 lb (97.523 kg) 222 lb 0.1 oz (100.7 kg)    PHYSICAL EXAM Filed Vitals:   03/06/15 0400 03/06/15 0500 03/06/15 0600 03/06/15 0700  BP: 137/88 126/91 137/81 138/92  Pulse: 67 71 73 75  Temp: 98.2 F (36.8 C)    98.3 F (36.8 C)  TempSrc: Oral   Oral  Resp: 18     Height:      Weight:      SpO2: 94% 95% 96% 96%   General: Alert, oriented x3, no distress Head: no evidence of trauma, PERRL, EOMI, no exophtalmos or lid lag, no myxedema, no xanthelasma; normal ears, nose and oropharynx Neck: normal jugular venous pulsations and no hepatojugular reflux; brisk carotid pulses without delay and no carotid bruits Chest: clear to auscultation, no signs of consolidation by percussion or palpation, normal fremitus, symmetrical and full respiratory excursions Cardiovascular: normal position and quality of the apical impulse, regular rhythm, normal first and second heart sounds, no rubs or gallops, no murmur Abdomen: no tenderness or distention, no masses by palpation, no abnormal pulsatility or arterial bruits, normal bowel sounds, no hepatosplenomegaly Extremities: no clubbing, cyanosis or edema; 2+ radial, ulnar and brachial pulses bilaterally; 2+ right femoral, posterior tibial and dorsalis pedis pulses; 2+ left femoral, posterior tibial and dorsalis pedis pulses; no subclavian or femoral bruits Neurological: grossly nonfocal  LABS  CBC  Recent Labs  03/04/15 2103 03/04/15 2108 03/05/15 0430  WBC 15.1*  --  10.2  NEUTROABS 10.9*  --   --   HGB 13.5 15.3 12.7*  HCT 40.4 45.0 37.3*  MCV 83.0  --  82.3  PLT 439*  --  440*   Basic   Metabolic Panel  Recent Labs  03/04/15 2103 03/04/15 2108 03/05/15 0430  NA 132* 137 132*  K 3.8 3.8 3.8  CL 98* 101 101  CO2 23  --  22  GLUCOSE 133* 134* 101*  BUN 12 15 10   CREATININE 1.12 1.10 0.89  CALCIUM 9.1  --  8.5*   Liver Function Tests No results for input(s): AST, ALT, ALKPHOS, BILITOT, PROT, ALBUMIN in the last 72 hours. No results for input(s): LIPASE, AMYLASE in the last 72 hours. Cardiac Enzymes  Recent Labs  03/04/15 2358 03/05/15 0430 03/05/15 0940  TROPONINI 1.14* 7.94* 7.08*    Recent Labs  03/05/15 0430  CHOL 207*  HDL 33*   LDLCALC 136*  TRIG 191*  CHOLHDL 6.3    Radiology Studies Imaging results have been reviewed and No results found.  TELE NSR  ECG NSR, evolving changes of inferior MI with subtle residual ST elevation and deep T wave inversion inferior leads  ASSESSMENT AND PLAN  S/P emergency PCI for inferior STEMI due to occluded RCA, scheduled for staged LAD PCI today. This procedure has been fully reviewed with the patient and written informed consent has been obtained. Reviewed purpose of statin and dual antiplatelet therapy, rehab, diet and exercise. Consider ACEi post cath, but not mandatory with preserved LVEF.   Thurmon Fair, MD, [facility] [phone] office [phone] pager 03/06/2015 8:45 AM

## 2015-03-06 NOTE — Care Management Note (Signed)
Case Management Note  Patient Details  Name: Justin Collins MRN: [id number] Date of Birth: 11-13-1965  Subjective/Objective:       Adm w mi             Action/Plan:lives w fam   Expected Discharge Date:                 Expected Discharge Plan:  Home/Self Care  In-House Referral:     Discharge planning Services  CM Consult, Medication Assistance  Post Acute Care Choice:    Choice offered to:     DME Arranged:    DME Agency:     HH Arranged:    HH Agency:     Status of Service:     Medicare Important Message Given:    Date Medicare IM Given:    Medicare IM give by:    Date Additional Medicare IM Given:    Additional Medicare Important Message give by:     If discussed at Long Length of Stay Meetings, dates discussed:    Additional Comments: ur review done, pt states has ins thru [employer]. Gave pt 30day free and copay assist card for brilinta.  Hanley Hays, RN 03/06/2015, 9:56 AM

## 2015-03-06 NOTE — Progress Notes (Signed)
TR BAND REMOVAL  LOCATION:    left radial  DEFLATED PER PROTOCOL:    Yes.    TIME BAND OFF / DRESSING APPLIED:    1500   SITE UPON ARRIVAL:    Level 0  SITE AFTER BAND REMOVAL:    Level 0  CIRCULATION SENSATION AND MOVEMENT:    Within Normal Limits   Yes.    COMMENTS:   Checked site at 1530 and frequently throughout remainder of shift with no change in assessment, CSMs wnls and dressing remains dry and intact

## 2015-03-06 NOTE — Progress Notes (Signed)
Patient Name: Orlan Aversa Date of Encounter: 03/06/2015  Principal Problem:   STEMI (ST elevation myocardial infarction) [facility]) Active Problems:   Hypertension   Acute inferoposterior myocardial infarction [facility])   CAD (coronary artery disease), native coronary artery   Length of Stay: 2  SUBJECTIVE: Feels well. No angina or dyspnea. Minimally tender right groin. Right wrist OK.   CATH 03/04/15  Dist LAD-1 lesion, 50% stenosed.  Dist LAD-2 lesion, 70% stenosed.  Ost Cx lesion, 50% stenosed.  Prox RCA lesion, 100% stenosed. There is a 0% residual stenosis post intervention.  A bare metal stent was placed.  LM lesion, 30% stenosed.  Prox LAD lesion, 75% stenosed.  1. Acute inferior wall MI with total occlusion of the proximal RCA treated successfully with primary PCI using a bare metal stent 2. Severe proximal LAD stenosis and diffuse distal LAD stenosis 3. Mild left main and proximal left circumflex stenosis 4. Preserved LV function with LVEF estimated at 60%  Recommend: Staged PCI of the proximal LAD, medical therapy for residual CAD. ASA, brilinta at least 12 months.   CURRENT MEDS . aspirin EC  81 mg Oral Daily  . atorvastatin  80 mg Oral q1800  . fentaNYL (SUBLIMAZE) injection  50 mcg Intravenous Once  . heparin  5,000 Units Subcutaneous 3 times per day  . metoprolol tartrate  12.5 mg Oral BID  . sodium chloride  3 mL Intravenous Q12H  . sodium chloride  3 mL Intravenous Q12H  . ticagrelor  90 mg Oral BID    OBJECTIVE   Intake/Output Summary (Last 24 hours) at 03/06/15 0845 Last data filed at 03/06/15 0600  Gross per 24 hour  Intake 705.32 ml  Output    950 ml  Net -244.68 ml   Filed Weights   03/04/15 2103 03/04/15 2300  Weight: 215 lb (97.523 kg) 222 lb 0.1 oz (100.7 kg)    PHYSICAL EXAM Filed Vitals:   03/06/15 0400 03/06/15 0500 03/06/15 0600 03/06/15 0700  BP: 137/88 126/91 137/81 138/92  Pulse: 67 71 73 75  Temp: 98.2 F (36.8 C)    98.3 F (36.8 C)  TempSrc: Oral   Oral  Resp: 18     Height:      Weight:      SpO2: 94% 95% 96% 96%   General: Alert, oriented x3, no distress Head: no evidence of trauma, PERRL, EOMI, no exophtalmos or lid lag, no myxedema, no xanthelasma; normal ears, nose and oropharynx Neck: normal jugular venous pulsations and no hepatojugular reflux; brisk carotid pulses without delay and no carotid bruits Chest: clear to auscultation, no signs of consolidation by percussion or palpation, normal fremitus, symmetrical and full respiratory excursions Cardiovascular: normal position and quality of the apical impulse, regular rhythm, normal first and second heart sounds, no rubs or gallops, no murmur Abdomen: no tenderness or distention, no masses by palpation, no abnormal pulsatility or arterial bruits, normal bowel sounds, no hepatosplenomegaly Extremities: no clubbing, cyanosis or edema; 2+ radial, ulnar and brachial pulses bilaterally; 2+ right femoral, posterior tibial and dorsalis pedis pulses; 2+ left femoral, posterior tibial and dorsalis pedis pulses; no subclavian or femoral bruits Neurological: grossly nonfocal  LABS  CBC  Recent Labs  03/04/15 2103 03/04/15 2108 03/05/15 0430  WBC 15.1*  --  10.2  NEUTROABS 10.9*  --   --   HGB 13.5 15.3 12.7*  HCT 40.4 45.0 37.3*  MCV 83.0  --  82.3  PLT 439*  --  440*   Basic  Metabolic Panel  Recent Labs  03/04/15 2103 03/04/15 2108 03/05/15 0430  NA 132* 137 132*  K 3.8 3.8 3.8  CL 98* 101 101  CO2 23  --  22  GLUCOSE 133* 134* 101*  BUN 12 15 10   CREATININE 1.12 1.10 0.89  CALCIUM 9.1  --  8.5*   Liver Function Tests No results for input(s): AST, ALT, ALKPHOS, BILITOT, PROT, ALBUMIN in the last 72 hours. No results for input(s): LIPASE, AMYLASE in the last 72 hours. Cardiac Enzymes  Recent Labs  03/04/15 2358 03/05/15 0430 03/05/15 0940  TROPONINI 1.14* 7.94* 7.08*    Recent Labs  03/05/15 0430  CHOL 207*  HDL 33*   LDLCALC 136*  TRIG 191*  CHOLHDL 6.3    Radiology Studies Imaging results have been reviewed and No results found.  TELE NSR  ECG NSR, evolving changes of inferior MI with subtle residual ST elevation and deep T wave inversion inferior leads  ASSESSMENT AND PLAN  S/P emergency PCI for inferior STEMI due to occluded RCA, scheduled for staged LAD PCI today. This procedure has been fully reviewed with the patient and written informed consent has been obtained. Reviewed purpose of statin and dual antiplatelet therapy, rehab, diet and exercise. Consider ACEi post cath, but not mandatory with preserved LVEF.   Thurmon Fair, MD, [facility] [phone] office [phone] pager 03/06/2015 8:45 AM

## 2015-03-06 NOTE — Interval H&P Note (Signed)
History and Physical Interval Note:  03/06/2015 10:21 AM  Justin Collins  has presented today for cardiac cath/PCI with the diagnosis of CAD/unstable angina. The risks and benefits and other options for treatment have been reviwed, the patient has consented to  Procedure(s): Coronary Stent Intervention (N/A) as a surgical intervention .  The patient's history has been reviewed, patient examined, no change in status, stable for surgery.  I have reviewed the patient's chart and labs.  Questions were answered to the patient's satisfaction.    Cath Lab Visit (complete for each Cath Lab visit)  Clinical Evaluation Leading to the Procedure:   ACS: Yes.    Non-ACS:    Anginal Classification: CCS IV  Anti-ischemic medical therapy: Maximal Therapy (2 or more classes of medications)  Non-Invasive Test Results: No non-invasive testing performed  Prior CABG: No previous CABG        Justin Collins

## 2015-03-07 ENCOUNTER — Encounter (HOSPITAL_COMMUNITY): Payer: Self-pay | Admitting: Physician Assistant

## 2015-03-07 LAB — BASIC METABOLIC PANEL
Anion gap: 11 (ref 5–15)
BUN: 10 mg/dL (ref 6–20)
CALCIUM: 8.6 mg/dL — AB (ref 8.9–10.3)
CHLORIDE: 101 mmol/L (ref 101–111)
CO2: 23 mmol/L (ref 22–32)
CREATININE: 1 mg/dL (ref 0.61–1.24)
Glucose, Bld: 82 mg/dL (ref 65–99)
Potassium: 3.9 mmol/L (ref 3.5–5.1)
SODIUM: 135 mmol/L (ref 135–145)

## 2015-03-07 LAB — CBC
HCT: 40.5 % (ref 39.0–52.0)
Hemoglobin: 13.3 g/dL (ref 13.0–17.0)
MCH: 27.6 pg (ref 26.0–34.0)
MCHC: 32.8 g/dL (ref 30.0–36.0)
MCV: 84 fL (ref 78.0–100.0)
PLATELETS: 454 10*3/uL — AB (ref 150–400)
RBC: 4.82 MIL/uL (ref 4.22–5.81)
RDW: 14.6 % (ref 11.5–15.5)
WBC: 12.4 10*3/uL — AB (ref 4.0–10.5)

## 2015-03-07 MED ORDER — METOPROLOL SUCCINATE ER 25 MG PO TB24: 25.0000 mg | ORAL_TABLET | Freq: Every day | ORAL | Status: DC

## 2015-03-07 MED ORDER — TICAGRELOR 90 MG PO TABS: 90.0000 mg | ORAL_TABLET | Freq: Two times a day (BID) | ORAL | Status: AC

## 2015-03-07 MED ORDER — NITROGLYCERIN 0.4 MG SL SUBL: 0.4000 mg | SUBLINGUAL_TABLET | SUBLINGUAL | Status: AC | PRN

## 2015-03-07 MED ORDER — ATORVASTATIN CALCIUM 80 MG PO TABS: 80.0000 mg | ORAL_TABLET | Freq: Every day | ORAL | Status: AC

## 2015-03-07 MED ORDER — ASPIRIN 81 MG PO TABS: 81.0000 mg | ORAL_TABLET | Freq: Every day | ORAL | Status: AC

## 2015-03-07 MED ORDER — ATORVASTATIN CALCIUM 80 MG PO TABS: 80.0000 mg | ORAL_TABLET | Freq: Every day | ORAL | Status: DC

## 2015-03-07 MED ORDER — TICAGRELOR 90 MG PO TABS: 90.0000 mg | ORAL_TABLET | Freq: Two times a day (BID) | ORAL | Status: DC

## 2015-03-07 MED ORDER — NITROGLYCERIN 0.4 MG SL SUBL: 0.4000 mg | SUBLINGUAL_TABLET | SUBLINGUAL | Status: DC | PRN

## 2015-03-07 MED ORDER — METOPROLOL SUCCINATE ER 25 MG PO TB24: 25.0000 mg | ORAL_TABLET | Freq: Every day | ORAL | Status: AC

## 2015-03-07 NOTE — Progress Notes (Signed)
CARDIAC REHAB PHASE I   PRE:  Rate/Rhythm: 95 SR    BP: sitting 153/85    SaO2:   MODE:  Ambulation: 500 ft   POST:  Rate/Rhythm: 121 ST    BP: sitting 130/90     SaO2:   Tolerated fairly well. Slow pace due to mild groin pain. HR and BP elevated. Even with standing and talking HR 120 ST. In depth ed of MI, stent, restrictions, Brilinta, diet and ex, CRPII and NTG. Voiced understanding, good reception. Requests his referral be sent to [facility] and [facility] (to see their different schedules). Will refer. [phone]  Elissa Lovett Kristan CES, ACSM 03/07/2015 9:22 AM

## 2015-03-07 NOTE — Care Management (Addendum)
CM provided pt with Brilinta booklet with 30 day free card and copay card enclosed. CM explained card usage to pt and  pt verbally stated understanding of how to use card. Benefit check done. Pt copay will be $70 -prior auth is not required. Pt made aware by CM. Gae Gallop RN, [place] [phone]

## 2015-03-07 NOTE — Research (Signed)
[employer] Research study presented to patient, questions encouraged and answered. Patient declined to participate.

## 2015-03-07 NOTE — Progress Notes (Signed)
Patient Name: Justin Collins Date of Encounter: 03/07/2015  Principal Problem:   STEMI (ST elevation myocardial infarction) [facility]) Active Problems:   Hypertension   Acute inferoposterior myocardial infarction [facility])   CAD (coronary artery disease), native coronary artery   Unstable angina (HCC)   Length of Stay: 3  SUBJECTIVE  No angina or dyspnea. Radial and groin sites without bleeding or hematoma. Walking in hall.  CURRENT MEDS . aspirin EC  81 mg Oral Daily  . atorvastatin  80 mg Oral q1800  . heparin  5,000 Units Subcutaneous 3 times per day  . metoprolol tartrate  12.5 mg Oral BID  . pantoprazole  40 mg Oral Daily  . sodium chloride  3 mL Intravenous Q12H  . ticagrelor  90 mg Oral BID    OBJECTIVE   Intake/Output Summary (Last 24 hours) at 03/07/15 0822 Last data filed at 03/07/15 0326  Gross per 24 hour  Intake  640.7 ml  Output    800 ml  Net -159.3 ml   Filed Weights   03/04/15 2103 03/04/15 2300 03/07/15 0324  Weight: 215 lb (97.523 kg) 222 lb 0.1 oz (100.7 kg) 222 lb 10.6 oz (101 kg)    PHYSICAL EXAM Filed Vitals:   03/06/15 1200 03/06/15 1638 03/06/15 2126 03/07/15 0324  BP: 103/59 113/78 131/88 128/75  Pulse: 74 70 79 75  Temp: 98 F (36.7 C) 98 F (36.7 C) 98.8 F (37.1 C) 99 F (37.2 C)  TempSrc: Oral Oral Oral Oral  Resp: Height:      Weight:    222 lb 10.6 oz (101 kg)  SpO2: 96% 96% 95% 95%   General: Alert, oriented x3, no distress Head: no evidence of trauma, PERRL, EOMI, no exophtalmos or lid lag, no myxedema, no xanthelasma; normal ears, nose and oropharynx Neck: normal jugular venous pulsations and no hepatojugular reflux; brisk carotid pulses without delay and no carotid bruits Chest: clear to auscultation, no signs of consolidation by percussion or palpation, normal fremitus, symmetrical and full respiratory excursions Cardiovascular: normal position and quality of the apical impulse, regular rhythm, normal first and  second heart sounds, no rubs or gallops, no murmur Abdomen: no tenderness or distention, no masses by palpation, no abnormal pulsatility or arterial bruits, normal bowel sounds, no hepatosplenomegaly Extremities: no clubbing, cyanosis or edema; 2+ radial, ulnar and brachial pulses bilaterally; 2+ right femoral, posterior tibial and dorsalis pedis pulses; 2+ left femoral, posterior tibial and dorsalis pedis pulses; no subclavian or femoral bruits R groin and radial access site OK. Neurological: grossly nonfocal  LABS  CBC  Recent Labs  03/04/15 2103  03/05/15 0430 03/07/15 0441  WBC 15.1*  --  10.2 12.4*  NEUTROABS 10.9*  --   --   --   HGB 13.5  < > 12.7* 13.3  HCT 40.4  < > 37.3* 40.5  MCV 83.0  --  82.3 84.0  PLT 439*  --  440* 454*  < > = values in this interval not displayed. Basic Metabolic Panel  Recent Labs  03/05/15 0430 03/07/15 0441  NA 132* 135  K 3.8 3.9  CL 101 101  CO2 22 23  GLUCOSE 101* 82  BUN 10 10  CREATININE 0.89 1.00  CALCIUM 8.5* 8.6*   Liver Function Tests No results for input(s): AST, ALT, ALKPHOS, BILITOT, PROT, ALBUMIN in the last 72 hours. No results for input(s): LIPASE, AMYLASE in the last 72 hours. Cardiac Enzymes  Recent Labs  03/04/15  2358 03/05/15 0430 03/05/15 0940  TROPONINI 1.14* 7.94* 7.08*   BNP Invalid input(s): POCBNP D-Dimer No results for input(s): DDIMER in the last 72 hours. Hemoglobin A1C  Recent Labs  03/05/15 1108  HGBA1C 6.2*   Fasting Lipid Panel  Recent Labs  03/05/15 0430  CHOL 207*  HDL 33*  LDLCALC 136*  TRIG 191*  CHOLHDL 6.3   Radiology Studies Imaging results have been reviewed and No results found.  TELE NSR  ASSESSMENT AND PLAN  DC home DAPT again reviewed Diet/exercise/rehab  Thurmon Fair, MD, [facility] [phone] office [phone] pager 03/07/2015 8:22 AM

## 2015-03-07 NOTE — Discharge Summary (Signed)
CARDIOLOGY DISCHARGE SUMMARY   Patient ID: Justin Collins MRN: [id number] DOB/AGE: 1966-04-18 49 y.o.  Admit date: 03/04/2015 Discharge date: 03/07/2015  PCP: No primary care provider on file. Primary Cardiologist: Dr. Royann Shivers  Primary Discharge Diagnosis:   STEMI (ST elevation myocardial infarction) (HCC) - s/p 4.0 x 12 mm MultiLink vision BMS to the RCA with staged PCI to the LAD with 3.0 x 16 mm Promus DES  Secondary Discharge Diagnosis:    Hypertension   Acute inferoposterior myocardial infarction [facility])   CAD (coronary artery disease), native coronary artery   Unstable angina (HCC)   Hyperlipidemia  Procedures: Cardiac catheterization, coronary arteriogram, left ventriculogram, BMS to the RCA, staged recatheterization with DES to the LAD  Hospital Course: Justin Collins is a 49 y.o. male with a history of hypertension. He had no previous cardiac issues. He developed chest pain and came to the hospital where he had inferior ST elevation and was taken emergently to the Cath Lab.  Cardiac catheterization results are below. His RCA was the culprit lesion and was treated with a bare-metal stent, reducing the stenosis to 0. His EF is preserved. He tolerated the procedure well. He was admitted to the ICU.  He continued to improve steadily and was seen by cardiac rehabilitation. He was educated on MI restrictions, heart-healthy lifestyle modifications and exercise guidelines. He is encouraged to follow-up with cardiology and cardiac rehabilitation as an outpatient.  At the initial catheterization, there was a 70% LAD lesion that was not initially treated. He was taken back to the cath lab on 03/06/2015 for staged PCI to the LAD. He tolerated this procedure well.  On 03/07/2015, he was seen by Dr. Royann Shivers and all data were reviewed. He was ambulating without chest pain or shortness of breath. He was having no problems with his cath sites. He was tolerating these medications well. No further  inpatient workup is indicated and he is considered stable for discharge, to follow up as an outpatient.  Labs:   Lab Results  Component Value Date   WBC 12.4* 03/07/2015   HGB 13.3 03/07/2015   HCT 40.5 03/07/2015   MCV 84.0 03/07/2015   PLT 454* 03/07/2015     Recent Labs Lab 03/07/15 0441  NA 135  K 3.9  CL 101  CO2 23  BUN 10  CREATININE 1.00  CALCIUM 8.6*  GLUCOSE 82    Recent Labs  03/04/15 2358 03/05/15 0430 03/05/15 0940  TROPONINI 1.14* 7.94* 7.08*   Lipid Panel     Component Value Date/Time   CHOL 207* 03/05/2015 0430   TRIG 191* 03/05/2015 0430   HDL 33* 03/05/2015 0430   CHOLHDL 6.3 03/05/2015 0430   VLDL 38 03/05/2015 0430   LDLCALC 136* 03/05/2015 0430    Recent Labs  03/04/15 2103  INR 1.15   Cardiac Cath: 03/04/2015  Dist LAD-1 lesion, 50% stenosed.  Dist LAD-2 lesion, 70% stenosed.  Ost Cx lesion, 50% stenosed.  Prox RCA lesion, 100% stenosed. There is a 0% residual stenosis post intervention.  A bare metal stent was placed.  LM lesion, 30% stenosed.  Prox LAD lesion, 75% stenosed. 1. Acute inferior wall MI with total occlusion of the proximal RCA treated successfully with primary PCI using a 4.0 x 12 mm MultiLink vision bare metal stent 2. Severe proximal LAD stenosis and diffuse distal LAD stenosis 3. Mild left main and proximal left circumflex stenosis 4. Preserved LV function with LVEF estimated at 60% Recommend: Staged PCI of the  proximal LAD, medical therapy for residual CAD. ASA, brilinta at least 12 months.  Cardiac Cath: 03/06/2015  Prox LAD lesion, 75% stenosed. There is a 0% residual stenosis post intervention.  A 3.0 x 16 mm Promus drug-eluting stent was placed. 1. Severe hazy stenosis proximal LAD with unstable angina 2. Staged PCI with placement DES x 1 proximal LAD Recommendations: Continue ASA, Brilinta, statin, beta blocker. To telemetry unit. Probable d/c home tomorrow in am.   EKG: 03/07/2015 Sinus  rhythm with inferior Q waves and T-wave inversions consistent with recent MI  FOLLOW UP PLANS AND APPOINTMENTS No Known Allergies   Medication List    TAKE these medications        aspirin 81 MG tablet  Take 1 tablet (81 mg total) by mouth daily.     atorvastatin 80 MG tablet  Commonly known as:  LIPITOR  Take 1 tablet (80 mg total) by mouth daily at 6 PM.     lisinopril 10 MG tablet  Commonly known as:  PRINIVIL,ZESTRIL  Take 10 mg by mouth daily.     metoprolol succinate 25 MG 24 hr tablet  Commonly known as:  TOPROL XL  Take 1 tablet (25 mg total) by mouth daily. Take with or immediately following a meal.     nitroGLYCERIN 0.4 MG SL tablet  Commonly known as:  NITROSTAT  Place 1 tablet (0.4 mg total) under the tongue every 5 (five) minutes as needed for chest pain.     ticagrelor 90 MG Tabs tablet  Commonly known as:  BRILINTA  Take 1 tablet (90 mg total) by mouth 2 (two) times daily.     traMADol 50 MG tablet  Commonly known as:  ULTRAM  Take 50 mg by mouth every 6 (six) hours as needed for moderate pain.        Discharge Instructions    Amb Referral to Cardiac Rehabilitation    Complete by:  As directed   Congestive Heart Failure: If diagnosis is Heart Failure, patient MUST meet each of the CMS criteria: 1. Left Ventricular Ejection Fraction </= 35% 2. NYHA class II-IV symptoms despite being on optimal heart failure therapy for at least 6 weeks. 3. Stable = have not had a recent (<6 weeks) or planned (<6 months) major cardiovascular hospitalization or procedure  Program Details: - Physician supervised classes - 1-3 classes per week over a 12-18 week period, generally for a total of 36 sessions  Physician Certification: I certify that the above Cardiac Rehabilitation treatment is medically necessary and is medically approved by me for treatment of this patient. The patient is willing and cooperative, able to ambulate and medically stable to participate in  exercise rehabilitation. The participant's progress and Individualized Treatment Plan will be reviewed by the Medical Director, Cardiac Rehab staff and as indicated by the Referring/Ordering Physician.  Diagnosis:   Myocardial Infarction Comment - to [facility] PCI       Diet - low sodium heart healthy    Complete by:  As directed      Increase activity slowly    Complete by:  As directed           Follow-up Information    Follow up with Thurmon Fair, MD.   Specialty:  Cardiology   Why:  The office will call.   Contact information:   [address] [place] [address] [phone]       BRING ALL MEDICATIONS WITH YOU TO FOLLOW UP APPOINTMENTS  Time  spent with patient to include physician time: 41 min Signed: Theodore Demark, PA-C 03/07/2015, 10:49 AM Co-Sign MD

## 2015-03-07 NOTE — Discharge Instructions (Signed)

## 2015-03-08 ENCOUNTER — Telehealth: Payer: Self-pay | Admitting: Cardiovascular Disease

## 2015-03-08 NOTE — Telephone Encounter (Signed)
Left detailed message on Justin Collins's confidential voice mail that it will be ok for the patient to go to cardiac rehab at [facility] but that we wait until after first post hospital visit to get started.   Advised that as of today his appointment is not scheduled. Advised also to send a form to be filled out if there is one and left our fax number.

## 2015-03-08 NOTE — Telephone Encounter (Signed)
Forwarding to Dr. Excell Seltzer.  Will fax to [facility] once okay'd by Dr. Excell Seltzer.

## 2015-03-08 NOTE — Telephone Encounter (Signed)
New message      Calling to get an order to participate in the [facility] program.  Please fax it to [phone]

## 2015-03-08 NOTE — Telephone Encounter (Signed)
This will be fine, but we typically wait until first post-hospital visit to start outpatient cardiac rehab. thx

## 2015-03-28 ENCOUNTER — Encounter: Payer: Self-pay | Admitting: Cardiology

## 2015-03-28 ENCOUNTER — Ambulatory Visit (INDEPENDENT_AMBULATORY_CARE_PROVIDER_SITE_OTHER): Payer: 59 | Admitting: Cardiology

## 2015-03-28 VITALS — BP 120/70 | HR 86 | Ht 67.0 in | Wt 218.0 lb

## 2015-03-28 DIAGNOSIS — I10 Essential (primary) hypertension: Secondary | ICD-10-CM | POA: Diagnosis not present

## 2015-03-28 DIAGNOSIS — E785 Hyperlipidemia, unspecified: Secondary | ICD-10-CM | POA: Diagnosis not present

## 2015-03-28 NOTE — Progress Notes (Signed)
03/28/2015 Justin Collins   Feb 16, 1966  [id number]  Primary Physician No PCP Per Patient Primary Cardiologist: Dr. Royann Shivers   Reason for Visit/CC:  Post hospital follow-up for CAD/STEMI  The patient  is a 49 year old male with a  history of hypertension and CAD who presents to clinic today for post hospital follow-up after a recent hospitalization where he was treated for an acute inferoposterior myocardial infarction.  At time of the initial catheterization, he was found to have an occluded RCA which was felt to be the culprit vessel. This was treated with emergent PCI utilizing a bare metal stent.  He was  also noted to have a 70% LAD lesion that was not initially treated. However, he was taken back to the [facility] Cath Lab 03/06/2015 for staged PCI of the LAD utilizing  a drug-eluting stent.  EF was well-preserved at 55-65%. Also notable, lipid panel showed an elevated LDL of 136 mg/DL. Hgb A1c  was 6.2, diagnostic for prediabetes.   He had no post cath complications and no recurrent CP. He was discharged home on DAPT with ASA + Brilinta, high-dose statin therapy with Lipitor, beta blocker therapy with Metoprolol and ACE inhibitor therapy with lisinopril.  He now presents to clinic for post hospital follow-up. He has done fairly well since discharge. He denies any recurrent anginal symptoms. No dyspnea. His only complaint is constant fatigue. No syncope/ near syncope. His HR and BP are both well controlled. No bradycardia and no hypotension. He reports full medication compliance. No abnormal bleeding on DAPT.   He has had some atypical left sided chest discomfort when he lies on his left side to sleep. It resolves when he changes positions on his right side.    Current Outpatient Prescriptions  Medication Sig Dispense Refill  . aspirin 81 MG tablet Take 1 tablet (81 mg total) by mouth daily.    Marland Kitchen atorvastatin (LIPITOR) 80 MG tablet Take 1 tablet (80 mg total) by mouth daily at 6 PM. 30 tablet  11  . lisinopril (PRINIVIL,ZESTRIL) 40 MG tablet TAKE ONE TABLET BY MOUTH ONCE DAILY    . metoprolol succinate (TOPROL-XL) 25 MG 24 hr tablet Take 1 tablet (25 mg total) by mouth daily. Take with or immediately following a meal. 30 tablet 11  . nitroGLYCERIN (NITROSTAT) 0.4 MG SL tablet Place 1 tablet (0.4 mg total) under the tongue every 5 (five) minutes as needed for chest pain. 25 tablet 3  . ticagrelor (BRILINTA) 90 MG TABS tablet Take 1 tablet (90 mg total) by mouth 2 (two) times daily. 60 tablet 11  . traMADol (ULTRAM) 50 MG tablet Take 50 mg by mouth every 6 (six) hours as needed for moderate pain.     No current facility-administered medications for this visit.    No Known Allergies  Social History   Social History  . Marital Status: Single    Spouse Name: N/A  . Number of Children: N/A  . Years of Education: N/A   Occupational History  . Not on file.   Social History Main Topics  . Smoking status: Never Smoker   . Smokeless tobacco: Not on file  . Alcohol Use: No  . Drug Use: No  . Sexual Activity: Not Currently   Other Topics Concern  . Not on file   Social History Narrative     Review of Systems: General: negative for chills, fever, night sweats or weight changes.  Cardiovascular: negative for chest pain, dyspnea on exertion, edema, orthopnea, palpitations,  paroxysmal nocturnal dyspnea or shortness of breath Dermatological: negative for rash Respiratory: negative for cough or wheezing Urologic: negative for hematuria Abdominal: negative for nausea, vomiting, diarrhea, bright red blood per rectum, melena, or hematemesis Neurologic: negative for visual changes, syncope, or dizziness All other systems reviewed and are otherwise negative except as noted above.    Blood pressure 120/70, pulse 86, height 5\' 7"  (1.702 m), weight 218 lb (98.884 kg).  General appearance: alert, cooperative, no distress and moderately obese Neck: no carotid bruit and no JVD Lungs:  clear to auscultation bilaterally Heart: regular rate and rhythm, S1, S2 normal, no murmur, click, rub or gallop Extremities: no LEE Pulses: 2+ and symmetric Skin: warm and dry Neurologic: Grossly normal  EKG NSR. No changes compared to discharge EKG  ASSESSMENT AND PLAN:   1. CAD: s/p recent STEMI 2/2 an occluded RCA, s/p emergent PCI + BMS. Also s/p staged PCI of the LAD with DES. Normal LVEF, 55-60%.  Denies any recurrent angina. Continue DAPT with ASA + Brilinta, high dose statin, BB and ACE-I. Plan to start OP cardiac rehab.   2. HLD: LDL uncontrolled at 136 mg/DL. Goal in the setting of recent MI/CAD is <70. Continue Lipitor 80 mg. Will recheck FLP and HFTs in 4 more weeks.   3. Pre-diabetes: Hgb A1c 6.2. Lifestyle modification encouraged. I've recommended that his PCP recheck level in 6 months.   4. HTN: BP is well controlled.    PLAN  F/u with Dr. Royann Shivers in 6 weeks.   Robbie Lis PA-C 03/28/2015 3:54 PM

## 2015-03-28 NOTE — Patient Instructions (Signed)
Medication Instructions:  Your physician recommends that you continue on your current medications as directed. Please refer to the Current Medication list given to you today.  Labwork: Your physician recommends that you return for a FASTING LIPID and LIVER profile in 4 WEEKS--nothing to eat or drink after midnight, lab opens at 7:30 AM  Testing/Procedures: No new orders.   Follow-Up: Your physician recommends that you schedule a follow-up appointment in: 6 WEEKS with Dr Excell Seltzer   Any Other Special Instructions Will Be Listed Below (If Applicable).  Note provided to the patient to return to work on 04/10/15 with no restrictions    If you need a refill on your cardiac medications before your next appointment, please call your pharmacy.

## 2015-04-25 ENCOUNTER — Other Ambulatory Visit (INDEPENDENT_AMBULATORY_CARE_PROVIDER_SITE_OTHER): Payer: 59

## 2015-04-25 DIAGNOSIS — E785 Hyperlipidemia, unspecified: Secondary | ICD-10-CM | POA: Diagnosis not present

## 2015-04-25 DIAGNOSIS — I10 Essential (primary) hypertension: Secondary | ICD-10-CM

## 2015-04-25 NOTE — Addendum Note (Signed)
Addended by: BOWDEN, ROBIN K on: 04/25/2015 08:30 AM   Modules accepted: Orders  

## 2015-05-03 ENCOUNTER — Encounter: Payer: Self-pay | Admitting: Cardiovascular Disease

## 2015-05-03 ENCOUNTER — Ambulatory Visit (INDEPENDENT_AMBULATORY_CARE_PROVIDER_SITE_OTHER): Payer: 59 | Admitting: Cardiovascular Disease

## 2015-05-03 VITALS — BP 130/88 | HR 93 | Ht 66.0 in | Wt 223.1 lb

## 2015-05-03 DIAGNOSIS — I10 Essential (primary) hypertension: Secondary | ICD-10-CM | POA: Diagnosis not present

## 2015-05-03 DIAGNOSIS — R079 Chest pain, unspecified: Secondary | ICD-10-CM

## 2015-05-03 DIAGNOSIS — I25119 Atherosclerotic heart disease of native coronary artery with unspecified angina pectoris: Secondary | ICD-10-CM

## 2015-05-03 MED ORDER — INDOMETHACIN 50 MG PO CAPS: 50.0000 mg | ORAL_CAPSULE | Freq: Two times a day (BID) | ORAL | Status: AC

## 2015-05-03 MED ORDER — COLCHICINE 0.6 MG PO TABS: 0.6000 mg | ORAL_TABLET | Freq: Every day | ORAL | Status: AC

## 2015-05-03 NOTE — Patient Instructions (Addendum)
Medication Instructions:  Your physician has recommended you make the following change in your medication:  1. START Indocin 50mg  take one tablet by mouth twice a day for 2 WEEKS 2. START Colchicine 0.6mg  take one tablet by mouth daily for 2 MONTHS 3. Continue to take Omeprazole 20mg  once a day  Labwork: Patient had lipid and liver drawn by PCP and these results will be faxed to our office.   Testing/Procedures: Your physician has requested that you have an echocardiogram. Echocardiography is a painless test that uses sound waves to create images of your heart. It provides your doctor with information about the size and shape of your heart and how well your heart's chambers and valves are working. This procedure takes approximately one hour. There are no restrictions for this procedure.  Follow-Up: Your physician recommends that you schedule a follow-up appointment in: 6 WEEKS with Dr Excell Seltzer   Any Other Special Instructions Will Be Listed Below (If Applicable).  Work note provided to patient.    If you need a refill on your cardiac medications before your next appointment, please call your pharmacy.

## 2015-05-03 NOTE — Progress Notes (Signed)
Cardiology Office Note Date:  05/03/2015   ID:  Justin Collins, DOB [birth year], MRN [id number]  PCP:  Edd Fabian, MD  Cardiologist:  Tonny Bollman, MD    Chief Complaint  Patient presents with  . Chest Pain    History of Present Illness: Justin Collins is a 49 y.o. male who presents for follow-up of CAD. He initially presented with an acute inferoposterior MI on 03-04-15. At time of the initial catheterization, he was found to have an occluded RCA which was felt to be the culprit vessel. This was treated with emergent PCI utilizing a bare metal stent. He was also noted to have a 70% LAD lesion that was not initially treated. However, he was taken back to the [facility] Cath Lab 03/06/2015 for staged PCI of the LAD utilizing a drug-eluting stent. EF was well-preserved at 55-65%. Also notable, lipid panel showed an elevated LDL of 136 mg/DL. Hgb A1c was 6.2, diagnostic for prediabetes. He had no post cath complications and no recurrent CP. He was discharged home on DAPT with ASA + Brilinta, high-dose statin therapy with Lipitor, beta blocker therapy with Metoprolol and ACE inhibitor therapy with lisinopril.  He was seen in follow-up in the [facility] ER and was diagnosed with pericarditis. Treated with 2 weeks of Indocin and felt better with this. Now with recurrent symptoms of chest pain. Pain is left-sided, sometimes worse with palpation of the left lateral chest. Has had occasional substernal chest pressure but this is associated with stress and anxiety. Pain is better with sitting upright. Worse on left side. Not associated with deep breathing or cough.   Past Medical History  Diagnosis Date  . Hypertension   . Migraines   . Myocardial infarction (HCC) 03/04/2015    BMS to the RCA (culprit lesion) and DES to the LAD    Past Surgical History  Procedure Laterality Date  . Cardiac catheterization    . Cardiac catheterization N/A 03/04/2015    Procedure: Left Heart Cath and Coronary  Angiography;  Surgeon: Tonny Bollman, MD; distal LAD 50 and 70%, CFX 50%, RCA 100% (culprit lesion), EF 60%   . Cardiac catheterization N/A 03/04/2015    Procedure: Coronary Stent Intervention;  Surgeon: Tonny Bollman, MD;  Location: [facility] INVASIVE CV LAB; 4.0 x 12 mm MultiLink Vision BMS to the RCA   . Cardiac catheterization N/A 03/06/2015    Procedure: Coronary Stent Intervention;  Surgeon: Kathleene Hazel, MD; 3.0 x 16 mm Promus DES to the LAD     Current Outpatient Prescriptions  Medication Sig Dispense Refill  . aspirin 81 MG tablet Take 1 tablet (81 mg total) by mouth daily.    Marland Kitchen atorvastatin (LIPITOR) 80 MG tablet Take 1 tablet (80 mg total) by mouth daily at 6 PM. 30 tablet 11  . lisinopril (PRINIVIL,ZESTRIL) 40 MG tablet TAKE ONE TABLET BY MOUTH ONCE DAILY    . metoprolol succinate (TOPROL-XL) 25 MG 24 hr tablet Take 1 tablet (25 mg total) by mouth daily. Take with or immediately following a meal. 30 tablet 11  . nitroGLYCERIN (NITROSTAT) 0.4 MG SL tablet Place 1 tablet (0.4 mg total) under the tongue every 5 (five) minutes as needed for chest pain. 25 tablet 3  . omeprazole (PRILOSEC) 20 MG capsule Take 20 mg by mouth daily.    . ticagrelor (BRILINTA) 90 MG TABS tablet Take 1 tablet (90 mg total) by mouth 2 (two) times daily. 60 tablet 11  . traMADol (ULTRAM) 50 MG tablet Take 50 mg  by mouth every 6 (six) hours as needed for moderate pain.    Marland Kitchen colchicine 0.6 MG tablet Take 1 tablet (0.6 mg total) by mouth daily. 30 tablet 1  . indomethacin (INDOCIN) 50 MG capsule Take 1 capsule (50 mg total) by mouth 2 (two) times daily with a meal. 28 capsule 0   No current facility-administered medications for this visit.    Allergies:   Review of patient's allergies indicates no known allergies.   Social History:  The patient  reports that he has never smoked. He does not have any smokeless tobacco history on file. He reports that he does not drink alcohol or use illicit drugs.   Family  History:  The patient's  family history includes Hypertension in his father and mother. There is no history of Heart attack or Stroke.    ROS:  Please see the history of present illness.  Otherwise, review of systems is positive for chest pain, constipation, nausea, headache.  All other systems are reviewed and negative.    PHYSICAL EXAM: VS:  BP 130/88 mmHg  Pulse 93  Ht  (1.676 m)  Wt 223 lb 1.9 oz (101.207 kg)  BMI 36.03 kg/m2  SpO2 98% , BMI Body mass index is 36.03 kg/(m^2). GEN: Well nourished, well developed, in no acute distress HEENT: normal Neck: no JVD, no masses. No carotid bruits Cardiac: RRR without murmur or gallop. No friction rub.     Respiratory:  clear to auscultation bilaterally, normal work of breathing GI: soft, nontender, nondistended, + BS MS: no deformity or atrophy Ext: no pretibial edema, pedal pulses 2+= bilaterally Skin: warm and dry, no rash Neuro:  Strength and sensation are intact Psych: euthymic mood, full affect  EKG:  EKG is ordered today. The ekg ordered today shows NSR with age-indeterminate inferoposterior MI  Recent Labs: 03/07/2015: BUN 10; Creatinine, Ser 1.00; Hemoglobin 13.3; Platelets 454*; Potassium 3.9; Sodium 135   Lipid Panel     Component Value Date/Time   CHOL 207* 03/05/2015 0430   TRIG 191* 03/05/2015 0430   HDL 33* 03/05/2015 0430   CHOLHDL 6.3 03/05/2015 0430   VLDL 38 03/05/2015 0430   LDLCALC 136* 03/05/2015 0430      Wt Readings from Last 3 Encounters:  05/03/15 223 lb 1.9 oz (101.207 kg)  03/28/15 218 lb (98.884 kg)  03/07/15 222 lb 10.6 oz (101 kg)     Cardiac Studies Reviewed: Cardiac CAth 03-04-15:   Conclusion     Dist LAD-1 lesion, 50% stenosed.  Dist LAD-2 lesion, 70% stenosed.  Ost Cx lesion, 50% stenosed.  Prox RCA lesion, 100% stenosed. There is a 0% residual stenosis post intervention.  A bare metal stent was placed.  LM lesion, 30% stenosed.  Prox LAD lesion, 75% stenosed.  1.  Acute inferior wall MI with total occlusion of the proximal RCA treated successfully with primary PCI using a bare metal stent 2. Severe proximal LAD stenosis and diffuse distal LAD stenosis 3. Mild left main and proximal left circumflex stenosis 4. Preserved LV function with LVEF estimated at 60%  Recommend: Staged PCI of the proximal LAD, medical therapy for residual CAD. ASA, brilinta at least 12 months.   Cardiac Cath 03-06-15: Conclusion     Prox LAD lesion, 75% stenosed. There is a 0% residual stenosis post intervention.  A drug-eluting stent was placed.  1. Severe hazy stenosis proximal LAD with unstable angina 2. Staged PCI with placement DES x 1 proximal LAD  Recommendations: Continue ASA,  Brilinta, statin, beta blocker. To telemetry unit. Probable d/c home tomorrow in am.      ASSESSMENT AND PLAN: 1.  CAD, native vessel, with old MI: tolerating DAPT. Continue post-MI medical therapy. Chest pain not typical of ischemic pain.  2. Chest pain, possible pericarditis. Pt does not have a friction rub or EKG changes consistent with this diagnosis. However, he does have chest pain improved with NSAID Rx, as well as some symptoms typical of pericarditis. Will check a 2D echo, treat with 2 more weeks of Indocin, and treat with 2 months of cochicine. I will see him back in 6 weeks. Advised to stay on Omeprazole for GI protection.  3. HTN: BP controlled on current Rx.   Current medicines are reviewed with the patient today.  The patient does not have concerns regarding medicines.  Labs/ tests ordered today include:   Orders Placed This Encounter  Procedures  . EKG 12-Lead  . Echocardiogram    Disposition:   FU 6 weeks  Signed, Tonny Bollman, MD  05/03/2015 3:02 PM    [facility] [address] [place], [place], [place]  [address] Phone: [phone]; Fax: [phone]

## 2015-05-12 ENCOUNTER — Other Ambulatory Visit: Payer: Self-pay

## 2015-05-12 ENCOUNTER — Ambulatory Visit (HOSPITAL_COMMUNITY): Payer: 59 | Attending: Cardiology

## 2015-05-12 DIAGNOSIS — I2510 Atherosclerotic heart disease of native coronary artery without angina pectoris: Secondary | ICD-10-CM | POA: Diagnosis present

## 2015-05-12 DIAGNOSIS — I10 Essential (primary) hypertension: Secondary | ICD-10-CM | POA: Diagnosis not present

## 2015-05-12 DIAGNOSIS — R079 Chest pain, unspecified: Secondary | ICD-10-CM | POA: Diagnosis not present

## 2015-05-12 DIAGNOSIS — I25119 Atherosclerotic heart disease of native coronary artery with unspecified angina pectoris: Secondary | ICD-10-CM

## 2015-06-14 ENCOUNTER — Ambulatory Visit: Payer: 59 | Admitting: Cardiovascular Disease

## 2015-08-04 ENCOUNTER — Ambulatory Visit: Payer: 59 | Admitting: Physician Assistant

## 2015-09-07 ENCOUNTER — Other Ambulatory Visit: Payer: Self-pay

## 2015-09-07 NOTE — Telephone Encounter (Signed)
Medication Detail      Disp Refills Start End     ticagrelor (BRILINTA) 90 MG TABS tablet 60 tablet 11 03/07/2015     Sig - Route: Take 1 tablet (90 mg total) by mouth 2 (two) times daily. - Oral    E-Prescribing Status: Receipt confirmed by pharmacy (03/07/2015 12:17 PM EDT)     Pharmacy    [id number] - [place], [place] - [address]

## 2015-10-20 ENCOUNTER — Encounter (HOSPITAL_COMMUNITY): Payer: Self-pay

## 2015-10-20 ENCOUNTER — Emergency Department (HOSPITAL_COMMUNITY)
Admission: EM | Admit: 2015-10-20 | Discharge: 2015-10-20 | Disposition: A | Discharge status: Home / Self Care | Payer: Worker's Compensation | Attending: Emergency Medicine | Admitting: Emergency Medicine

## 2015-10-20 ENCOUNTER — Emergency Department (HOSPITAL_COMMUNITY): Payer: Worker's Compensation

## 2015-10-20 DIAGNOSIS — Y999 Unspecified external cause status: Secondary | ICD-10-CM | POA: Insufficient documentation

## 2015-10-20 DIAGNOSIS — I10 Essential (primary) hypertension: Secondary | ICD-10-CM | POA: Insufficient documentation

## 2015-10-20 DIAGNOSIS — W0110XA Fall on same level from slipping, tripping and stumbling with subsequent striking against unspecified object, initial encounter: Secondary | ICD-10-CM | POA: Diagnosis not present

## 2015-10-20 DIAGNOSIS — R51 Headache: Secondary | ICD-10-CM | POA: Diagnosis present

## 2015-10-20 DIAGNOSIS — Y939 Activity, unspecified: Secondary | ICD-10-CM | POA: Insufficient documentation

## 2015-10-20 DIAGNOSIS — Z79899 Other long term (current) drug therapy: Secondary | ICD-10-CM | POA: Diagnosis not present

## 2015-10-20 DIAGNOSIS — Y929 Unspecified place or not applicable: Secondary | ICD-10-CM | POA: Diagnosis not present

## 2015-10-20 DIAGNOSIS — Z7982 Long term (current) use of aspirin: Secondary | ICD-10-CM | POA: Diagnosis not present

## 2015-10-20 DIAGNOSIS — W19XXXA Unspecified fall, initial encounter: Secondary | ICD-10-CM

## 2015-10-20 DIAGNOSIS — I252 Old myocardial infarction: Secondary | ICD-10-CM | POA: Diagnosis not present

## 2015-10-20 LAB — CBC WITH DIFFERENTIAL/PLATELET
BASOS ABS: 0 10*3/uL (ref 0.0–0.1)
BASOS PCT: 0 %
EOS PCT: 2 %
Eosinophils Absolute: 0.2 10*3/uL (ref 0.0–0.7)
HCT: 37.4 % — ABNORMAL LOW (ref 39.0–52.0)
Hemoglobin: 12.2 g/dL — ABNORMAL LOW (ref 13.0–17.0)
LYMPHS PCT: 23 %
Lymphs Abs: 2.5 10*3/uL (ref 0.7–4.0)
MCH: 27.4 pg (ref 26.0–34.0)
MCHC: 32.6 g/dL (ref 30.0–36.0)
MCV: 84 fL (ref 78.0–100.0)
MONO ABS: 0.5 10*3/uL (ref 0.1–1.0)
MONOS PCT: 5 %
Neutro Abs: 7.5 10*3/uL (ref 1.7–7.7)
Neutrophils Relative %: 70 %
PLATELETS: 463 10*3/uL — AB (ref 150–400)
RBC: 4.45 MIL/uL (ref 4.22–5.81)
RDW: 14.3 % (ref 11.5–15.5)
WBC: 10.8 10*3/uL — AB (ref 4.0–10.5)

## 2015-10-20 LAB — BASIC METABOLIC PANEL
Anion gap: 7 (ref 5–15)
BUN: 12 mg/dL (ref 6–20)
CHLORIDE: 107 mmol/L (ref 101–111)
CO2: 25 mmol/L (ref 22–32)
CREATININE: 0.88 mg/dL (ref 0.61–1.24)
Calcium: 8.9 mg/dL (ref 8.9–10.3)
GFR calc Af Amer: >60 mL/min (ref >60)
GFR calc non Af Amer: >60 mL/min (ref >60)
Glucose, Bld: 115 mg/dL — ABNORMAL HIGH (ref 65–99)
POTASSIUM: 3.6 mmol/L (ref 3.5–5.1)
Sodium: 139 mmol/L (ref 135–145)

## 2015-10-20 MED ORDER — DIPHENHYDRAMINE HCL 50 MG/ML IJ SOLN
INTRAMUSCULAR | Status: AC
  Administered 2015-10-20: 15:00:00
  Filled 2015-10-20: qty 1

## 2015-10-20 MED ORDER — PROCHLORPERAZINE EDISYLATE 5 MG/ML IJ SOLN
10.0000 mg | Freq: Once | INTRAMUSCULAR | Status: AC
  Administered 2015-10-20: 10 mg via INTRAVENOUS
  Filled 2015-10-20: qty 2

## 2015-10-20 MED ORDER — DEXAMETHASONE SODIUM PHOSPHATE 10 MG/ML IJ SOLN
10.0000 mg | Freq: Once | INTRAMUSCULAR | Status: AC
  Administered 2015-10-20: 10 mg via INTRAVENOUS
  Filled 2015-10-20: qty 1

## 2015-10-20 MED ORDER — DIPHENHYDRAMINE HCL 50 MG/ML IJ SOLN
25.0000 mg | Freq: Once | INTRAMUSCULAR | Status: AC
  Administered 2015-10-20: 25 mg via INTRAVENOUS

## 2015-10-20 MED ORDER — KETOROLAC TROMETHAMINE 30 MG/ML IJ SOLN
30.0000 mg | Freq: Once | INTRAMUSCULAR | Status: AC
  Administered 2015-10-20: 30 mg via INTRAVENOUS
  Filled 2015-10-20: qty 1

## 2015-10-20 MED ORDER — SODIUM CHLORIDE 0.9 % IV BOLUS (SEPSIS)
1000.0000 mL | Freq: Once | INTRAVENOUS | Status: AC
  Administered 2015-10-20: 1000 mL via INTRAVENOUS

## 2015-10-20 NOTE — ED Notes (Addendum)
According to EMS, pt slipped on a puddle of water on the floor and hit is head on a wall when falling to the floor. Pt denies loss of consciousness. Pt states he is on antiplatelet meds. Pt c/o HA and minor neck pain. Pt arrives A+OX4, speaking in complete sentences, pt ambulatory at the scene.

## 2015-10-20 NOTE — ED Notes (Signed)
Bed: WA20 Expected date:  Expected time:  Means of arrival:  Comments: EMS- 50yo M, fall/head injury

## 2015-10-20 NOTE — ED Provider Notes (Signed)
CSN: [id number]     Arrival date & time 10/20/15  1155 History   First MD Initiated Contact with Patient 10/20/15 1158     Chief Complaint  Patient presents with  . Fall    HPI  Patient presents after mechanical fall. Patient recalls slipping, falling and striking his head against the floor in a solid object. He is fairly certain that he did not lose consciousness, but since the fall has had ongoing pain in the anterior head, as well as neck pain, diffuse, sore. No weakness in any extremity, no vision changes, though there is new photosensitivity. No medication taken since the fall for pain relief. Patient has a notable history of migraine headaches, and MRI last year. Patient takes Brilinta.   Past Medical History  Diagnosis Date  . Hypertension   . Migraines   . Myocardial infarction (HCC) 03/04/2015    BMS to the RCA (culprit lesion) and DES to the LAD   Past Surgical History  Procedure Laterality Date  . Cardiac catheterization    . Cardiac catheterization N/A 03/04/2015    Procedure: Left Heart Cath and Coronary Angiography;  Surgeon: Tonny Bollman, MD; distal LAD 50 and 70%, CFX 50%, RCA 100% (culprit lesion), EF 60%   . Cardiac catheterization N/A 03/04/2015    Procedure: Coronary Stent Intervention;  Surgeon: Tonny Bollman, MD;  Location: [facility] INVASIVE CV LAB; 4.0 x 12 mm MultiLink Vision BMS to the RCA   . Cardiac catheterization N/A 03/06/2015    Procedure: Coronary Stent Intervention;  Surgeon: Kathleene Hazel, MD; 3.0 x 16 mm Promus DES to the LAD    Family History  Problem Relation Age of Onset  . Heart attack Neg Hx   . Stroke Neg Hx   . Hypertension Mother   . Hypertension Father    Social History  Substance Use Topics  . Smoking status: Never Smoker   . Smokeless tobacco: None  . Alcohol Use: No    Review of Systems  Constitutional:       Per HPI, otherwise negative  HENT:       Per HPI, otherwise negative  Eyes: Positive for photophobia.   Respiratory:       Per HPI, otherwise negative  Cardiovascular:       Per HPI, otherwise negative  Gastrointestinal: Negative for vomiting.  Endocrine:       Negative aside from HPI  Genitourinary:       Neg aside from HPI   Musculoskeletal:       Per HPI, otherwise negative  Skin: Negative.   Neurological: Positive for headaches. Negative for syncope and weakness.      Allergies  Review of patient's allergies indicates no known allergies.  Home Medications   Prior to Admission medications   Medication Sig Start Date End Date Taking? Authorizing Provider  aspirin 81 MG tablet Take 1 tablet (81 mg total) by mouth daily. 03/07/15  Yes Rhonda G Barrett, PA-C  atorvastatin (LIPITOR) 80 MG tablet Take 1 tablet (80 mg total) by mouth daily at 6 PM. 03/07/15  Yes Rhonda G Barrett, PA-C  lisinopril (PRINIVIL,ZESTRIL) 40 MG tablet TAKE 40 MG BY MOUTH ONCE DAILY 12/23/14  Yes Historical Provider, MD  metoprolol succinate (TOPROL-XL) 25 MG 24 hr tablet Take 1 tablet (25 mg total) by mouth daily. Take with or immediately following a meal. 03/07/15  Yes Rhonda G Barrett, PA-C  nitroGLYCERIN (NITROSTAT) 0.4 MG SL tablet Place 1 tablet (0.4 mg total) under the tongue  every 5 (five) minutes as needed for chest pain. 03/07/15  Yes Rhonda G Barrett, PA-C  omeprazole (PRILOSEC) 20 MG capsule Take 20 mg by mouth 2 (two) times daily.    Yes Historical Provider, MD  Phenyleph-Diphenhyd-DM-APAP 10-37.5-20-575 MG TABS Take 1 tablet by mouth daily as needed (ALLERGIES).   Yes Historical Provider, MD  ticagrelor (BRILINTA) 90 MG TABS tablet Take 1 tablet (90 mg total) by mouth 2 (two) times daily. 03/07/15  Yes Rhonda G Barrett, PA-C  traMADol (ULTRAM) 50 MG tablet Take 50 mg by mouth every 6 (six) hours as needed for moderate pain.   Yes Historical Provider, MD  colchicine 0.6 MG tablet Take 1 tablet (0.6 mg total) by mouth daily. Patient not taking: Reported on 10/20/2015 05/03/15   Tonny Bollman, MD   indomethacin (INDOCIN) 50 MG capsule Take 1 capsule (50 mg total) by mouth 2 (two) times daily with a meal. Patient not taking: Reported on 10/20/2015 05/03/15   Tonny Bollman, MD   BP 163/94 mmHg  Pulse 73  Temp(Src) 98.5 F (36.9 C) (Oral)  Resp 15  Ht 5\' 7"  (1.702 m)  Wt 225 lb (102.059 kg)  BMI 35.23 kg/m2  SpO2 99% Physical Exam  Constitutional: He is oriented to person, place, and time. He appears well-developed. No distress.  HENT:  Head: Normocephalic and atraumatic.  No substantial tenderness, nor appreciable deformity on the forehead, though there is minimal discomfort with palpation on the left superior aspect  Eyes: Conjunctivae and EOM are normal.  Neck:  Minimal tenderness to palpation midline mid cervical area, no deformity, pain with lateral range of motion testing  Cardiovascular: Normal rate and regular rhythm.   Pulmonary/Chest: Effort normal. No stridor. No respiratory distress.  Abdominal: He exhibits no distension.  Musculoskeletal: He exhibits no edema.  Neurological: He is alert and oriented to person, place, and time. No cranial nerve deficit. He exhibits normal muscle tone. Coordination normal.  Skin: Skin is warm and dry.  Psychiatric: He has a normal mood and affect.  Nursing note and vitals reviewed.   ED Course  Procedures (including critical care time) Labs Review Labs Reviewed  CBC WITH DIFFERENTIAL/PLATELET - Abnormal; Notable for the following:    WBC 10.8 (*)    Hemoglobin 12.2 (*)    HCT 37.4 (*)    Platelets 463 (*)    All other components within normal limits  BASIC METABOLIC PANEL    Imaging Review Ct Head Wo Contrast  10/20/2015  CLINICAL DATA:  Pain following fall EXAM: CT HEAD WITHOUT CONTRAST CT CERVICAL SPINE WITHOUT CONTRAST TECHNIQUE: Multidetector CT imaging of the head and cervical spine was performed following the standard protocol without intravenous contrast. Multiplanar CT image reconstructions of the cervical spine  were also generated. COMPARISON:  None. FINDINGS: CT HEAD FINDINGS The ventricles are normal in size and configuration. There is no intracranial mass, hemorrhage, extra-axial fluid collection, or midline shift. Gray-white compartments appear normal. No acute infarct evident. The bony calvarium appears intact. The mastoid air cells are clear. Orbits appear symmetric bilaterally. There is mild mucosal thickening in several ethmoid air cells bilaterally. CT CERVICAL SPINE FINDINGS There is no fracture or spondylolisthesis. Prevertebral soft tissues and predental space regions are normal. The disc spaces appear normal. No nerve root edema or effacement. No disc extrusion or stenosis. IMPRESSION: CT head: Mild ethmoid sinus disease. No intracranial mass, hemorrhage, or extra-axial fluid collection. Gray-white compartments appear normal. CT cervical spine: No fracture or spondylolisthesis. No disc extrusion or  stenosis. No nerve root edema or effacement. Electronically Signed   By: Bretta Bang III M.D.   On: 10/20/2015 12:37   Ct Cervical Spine Wo Contrast  10/20/2015  CLINICAL DATA:  Pain following fall EXAM: CT HEAD WITHOUT CONTRAST CT CERVICAL SPINE WITHOUT CONTRAST TECHNIQUE: Multidetector CT imaging of the head and cervical spine was performed following the standard protocol without intravenous contrast. Multiplanar CT image reconstructions of the cervical spine were also generated. COMPARISON:  None. FINDINGS: CT HEAD FINDINGS The ventricles are normal in size and configuration. There is no intracranial mass, hemorrhage, extra-axial fluid collection, or midline shift. Gray-white compartments appear normal. No acute infarct evident. The bony calvarium appears intact. The mastoid air cells are clear. Orbits appear symmetric bilaterally. There is mild mucosal thickening in several ethmoid air cells bilaterally. CT CERVICAL SPINE FINDINGS There is no fracture or spondylolisthesis. Prevertebral soft tissues and  predental space regions are normal. The disc spaces appear normal. No nerve root edema or effacement. No disc extrusion or stenosis. IMPRESSION: CT head: Mild ethmoid sinus disease. No intracranial mass, hemorrhage, or extra-axial fluid collection. Gray-white compartments appear normal. CT cervical spine: No fracture or spondylolisthesis. No disc extrusion or stenosis. No nerve root edema or effacement. Electronically Signed   By: Bretta Bang III M.D.   On: 10/20/2015 12:37   I have personally reviewed and evaluated these images and lab results as part of my medical decision-making.  MDM  Patient with ongoing and to coagulate use presents after mechanical fall. Patient had head trauma, and some new neurologic subjective complaints following the event. Here his evaluation has been reassuring, no evidence for intracranial bleed, no evidence for fracture. Patient also has history of migraine headaches. Today's evaluation was reassuring, with no evidence for bleed, fracture, new neurologic dysfunction. Patient discharged in stable condition with analgesia, primary care follow-up.  Gerhard Munch, MD 10/20/15 1336

## 2015-10-20 NOTE — Discharge Instructions (Signed)
As discussed, your evaluation today has been largely reassuring.  But, it is important that you monitor your condition carefully, and do not hesitate to return to the ED if you develop new, or concerning changes in your condition.  Otherwise, please follow-up with your physician for appropriate ongoing care.  Concussion, Adult A concussion, or closed-head injury, is a brain injury caused by a direct blow to the head or by a quick and sudden movement (jolt) of the head or neck. Concussions are usually not life-threatening. Even so, the effects of a concussion can be serious. If you have had a concussion before, you are more likely to experience concussion-like symptoms after a direct blow to the head.  CAUSES  Direct blow to the head, such as from running into another player during a soccer game, being hit in a fight, or hitting your head on a hard surface.  A jolt of the head or neck that causes the brain to move back and forth inside the skull, such as in a car crash. SIGNS AND SYMPTOMS The signs of a concussion can be hard to notice. Early on, they may be missed by you, family members, and health care providers. You may look fine but act or feel differently. Symptoms are usually temporary, but they may last for days, weeks, or even longer. Some symptoms may appear right away while others may not show up for hours or days. Every head injury is different. Symptoms include:  Mild to moderate headaches that will not go away.  A feeling of pressure inside your head.  Having more trouble than usual:  Learning or remembering things you have heard.  Answering questions.  Paying attention or concentrating.  Organizing daily tasks.  Making decisions and solving problems.  Slowness in thinking, acting or reacting, speaking, or reading.  Getting lost or being easily confused.  Feeling tired all the time or lacking energy (fatigued).  Feeling drowsy.  Sleep disturbances.  Sleeping more  than usual.  Sleeping less than usual.  Trouble falling asleep.  Trouble sleeping (insomnia).  Loss of balance or feeling lightheaded or dizzy.  Nausea or vomiting.  Numbness or tingling.  Increased sensitivity to:  Sounds.  Lights.  Distractions.  Vision problems or eyes that tire easily.  Diminished sense of taste or smell.  Ringing in the ears.  Mood changes such as feeling sad or anxious.  Becoming easily irritated or angry for little or no reason.  Lack of motivation.  Seeing or hearing things other people do not see or hear (hallucinations). DIAGNOSIS Your health care provider can usually diagnose a concussion based on a description of your injury and symptoms. He or she will ask whether you passed out (lost consciousness) and whether you are having trouble remembering events that happened right before and during your injury. Your evaluation might include:  A brain scan to look for signs of injury to the brain. Even if the test shows no injury, you may still have a concussion.  Blood tests to be sure other problems are not present. TREATMENT  Concussions are usually treated in an emergency department, in urgent care, or at a clinic. You may need to stay in the hospital overnight for further treatment.  Tell your health care provider if you are taking any medicines, including prescription medicines, over-the-counter medicines, and natural remedies. Some medicines, such as blood thinners (anticoagulants) and aspirin, may increase the chance of complications. Also tell your health care provider whether you have had alcohol or  are taking illegal drugs. This information may affect treatment.  Your health care provider will send you home with important instructions to follow.  How fast you will recover from a concussion depends on many factors. These factors include how severe your concussion is, what part of your brain was injured, your age, and how healthy you were  before the concussion.  Most people with mild injuries recover fully. Recovery can take time. In general, recovery is slower in older persons. Also, persons who have had a concussion in the past or have other medical problems may find that it takes longer to recover from their current injury. HOME CARE INSTRUCTIONS General Instructions  Carefully follow the directions your health care provider gave you.  Only take over-the-counter or prescription medicines for pain, discomfort, or fever as directed by your health care provider.  Take only those medicines that your health care provider has approved.  Do not drink alcohol until your health care provider says you are well enough to do so. Alcohol and certain other drugs may slow your recovery and can put you at risk of further injury.  If it is harder than usual to remember things, write them down.  If you are easily distracted, try to do one thing at a time. For example, do not try to watch TV while fixing dinner.  Talk with family members or close friends when making important decisions.  Keep all follow-up appointments. Repeated evaluation of your symptoms is recommended for your recovery.  Watch your symptoms and tell others to do the same. Complications sometimes occur after a concussion. Older adults with a brain injury may have a higher risk of serious complications, such as a blood clot on the brain.  Tell your teachers, school nurse, school counselor, coach, athletic trainer, or work Freight forwarder about your injury, symptoms, and restrictions. Tell them about what you can or cannot do. They should watch for:  Increased problems with attention or concentration.  Increased difficulty remembering or learning new information.  Increased time needed to complete tasks or assignments.  Increased irritability or decreased ability to cope with stress.  Increased symptoms.  Rest. Rest helps the brain to heal. Make sure you:  Get plenty of  sleep at night. Avoid staying up late at night.  Keep the same bedtime hours on weekends and weekdays.  Rest during the day. Take daytime naps or rest breaks when you feel tired.  Limit activities that require a lot of thought or concentration. These include:  Doing homework or job-related work.  Watching TV.  Working on the computer.  Avoid any situation where there is potential for another head injury (football, hockey, soccer, basketball, martial arts, downhill snow sports and horseback riding). Your condition will get worse every time you experience a concussion. You should avoid these activities until you are evaluated by the appropriate follow-up health care providers. Returning To Your Regular Activities You will need to return to your normal activities slowly, not all at once. You must give your body and brain enough time for recovery.  Do not return to sports or other athletic activities until your health care provider tells you it is safe to do so.  Ask your health care provider when you can drive, ride a bicycle, or operate heavy machinery. Your ability to react may be slower after a brain injury. Never do these activities if you are dizzy.  Ask your health care provider about when you can return to work or school. Preventing Another  Concussion It is very important to avoid another brain injury, especially before you have recovered. In rare cases, another injury can lead to permanent brain damage, brain swelling, or death. The risk of this is greatest during the first 7-10 days after a head injury. Avoid injuries by:  Wearing a seat belt when riding in a car.  Drinking alcohol only in moderation.  Wearing a helmet when biking, skiing, skateboarding, skating, or doing similar activities.  Avoiding activities that could lead to a second concussion, such as contact or recreational sports, until your health care provider says it is okay.  Taking safety measures in your  home.  Remove clutter and tripping hazards from floors and stairways.  Use grab bars in bathrooms and handrails by stairs.  Place non-slip mats on floors and in bathtubs.  Improve lighting in dim areas. SEEK MEDICAL CARE IF:  You have increased problems paying attention or concentrating.  You have increased difficulty remembering or learning new information.  You need more time to complete tasks or assignments than before.  You have increased irritability or decreased ability to cope with stress.  You have more symptoms than before. Seek medical care if you have any of the following symptoms for more than 2 weeks after your injury:  Lasting (chronic) headaches.  Dizziness or balance problems.  Nausea.  Vision problems.  Increased sensitivity to noise or light.  Depression or mood swings.  Anxiety or irritability.  Memory problems.  Difficulty concentrating or paying attention.  Sleep problems.  Feeling tired all the time. SEEK IMMEDIATE MEDICAL CARE IF:  You have severe or worsening headaches. These may be a sign of a blood clot in the brain.  You have weakness (even if only in one hand, leg, or part of the face).  You have numbness.  You have decreased coordination.  You vomit repeatedly.  You have increased sleepiness.  One pupil is larger than the other.  You have convulsions.  You have slurred speech.  You have increased confusion. This may be a sign of a blood clot in the brain.  You have increased restlessness, agitation, or irritability.  You are unable to recognize people or places.  You have neck pain.  It is difficult to wake you up.  You have unusual behavior changes.  You lose consciousness. MAKE SURE YOU:  Understand these instructions.  Will watch your condition.  Will get help right away if you are not doing well or get worse.   This information is not intended to replace advice given to you by your health care  provider. Make sure you discuss any questions you have with your health care provider.   Document Released: 08/10/2003 Document Revised: 06/10/2014 Document Reviewed: 12/10/2012 [employer] Interactive Patient Education [employer]

## 2017-04-19 IMAGING — CT CT HEAD W/O CM
3 of 6 series · 14 of 47 positions shown, 16 images · non-contrast
Comparison: None.

CLINICAL DATA: Pain following fall

EXAM:
CT HEAD WITHOUT CONTRAST
CT CERVICAL SPINE WITHOUT CONTRAST
TECHNIQUE: Multidetector CT imaging of the head and cervical spine was
performed following the standard protocol without intravenous
contrast. Multiplanar CT image reconstructions of the cervical spine
were also generated.

[Series 8: axial recon · axial · 0.23mm/px · z∈[-291,-157]mm · 8 of 95 slices shown, 10 images]
[im 9/95  brain]
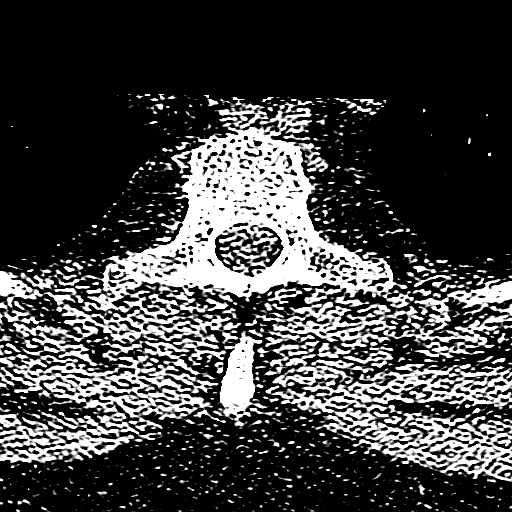
[im 9/95  bone]
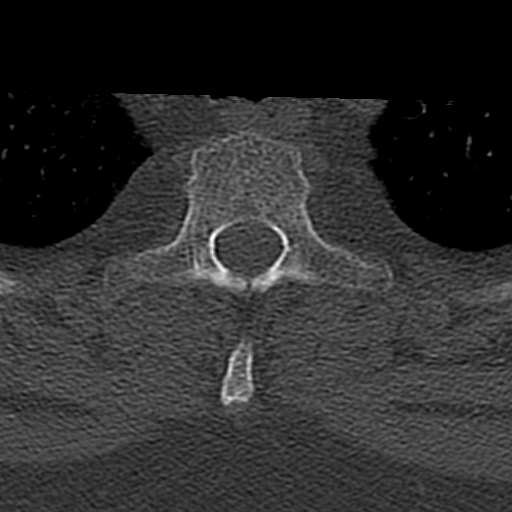
[im 18/95  brain]
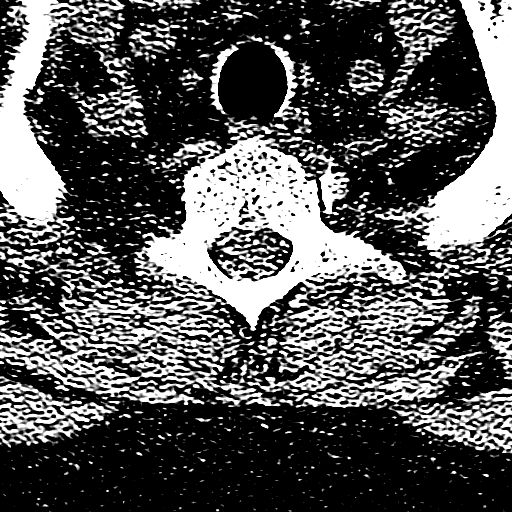
[im 35/95  brain]
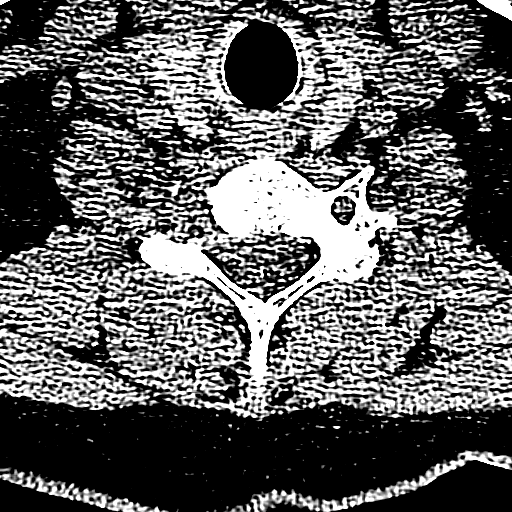
[im 43/95  brain]
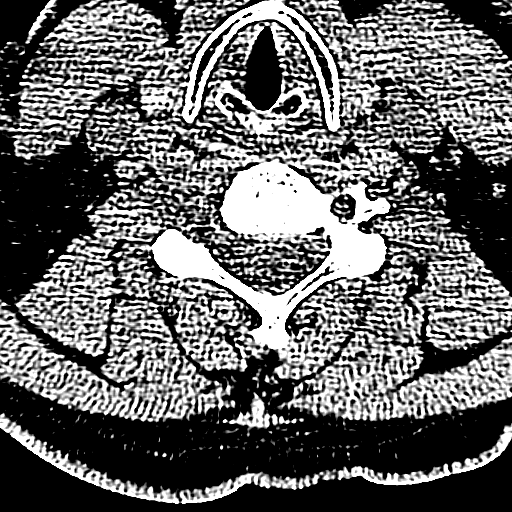
[im 52/95  brain]
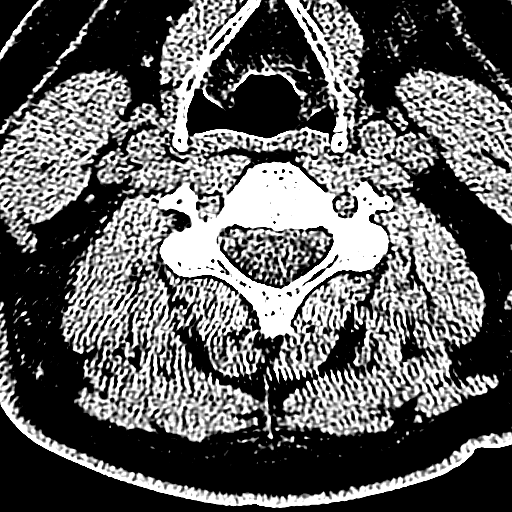
[im 52/95  bone]
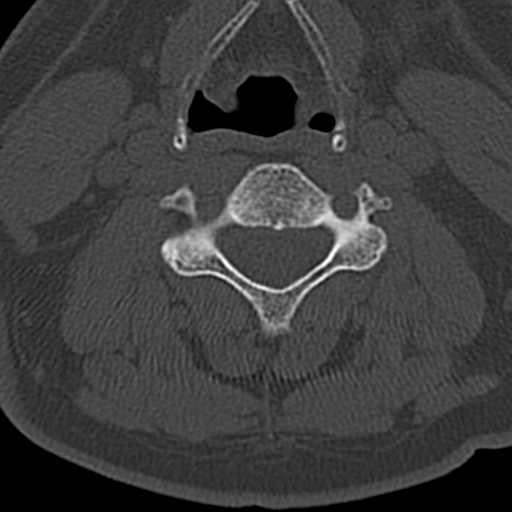
[im 60/95  brain]
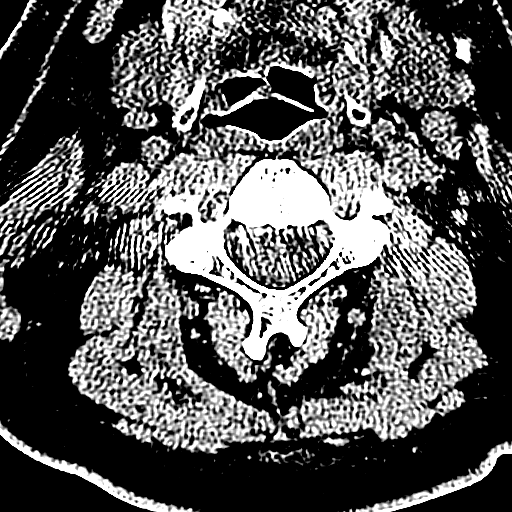
[im 77/95  brain]
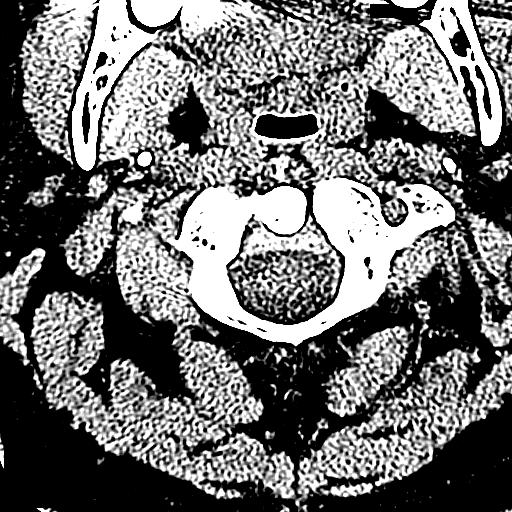
[im 86/95  brain]
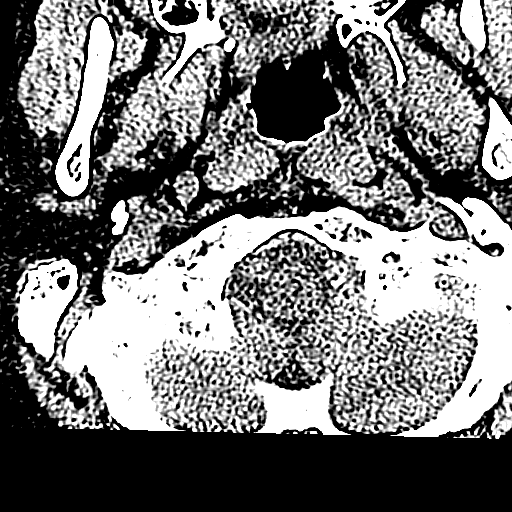

[Series 9: coronal · coronal · 0.25mm/px · 3 of 61 slices shown]
[im 21/61  brain]
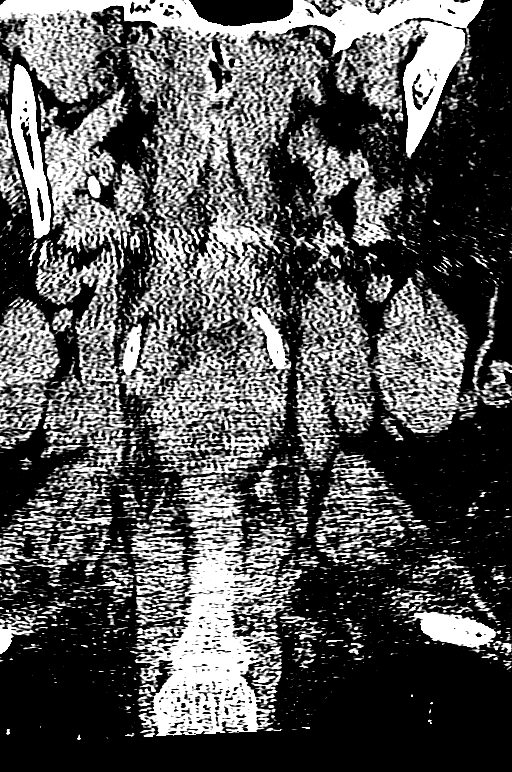
[im 27/61  brain]
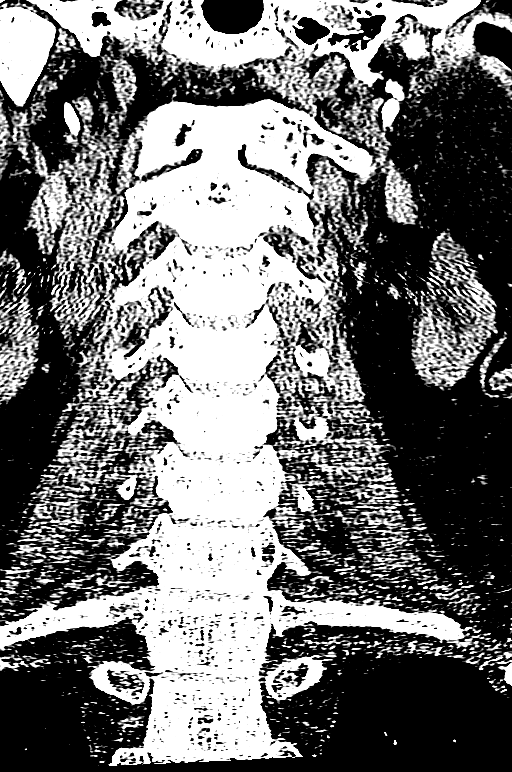
[im 34/61  brain]
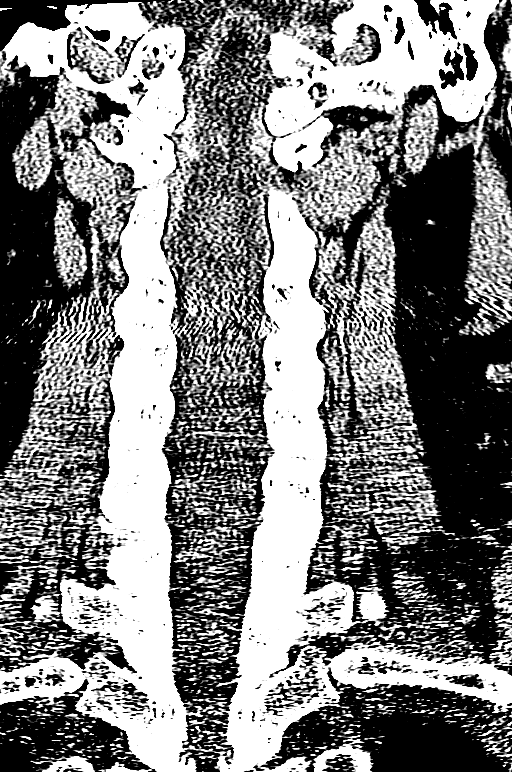

[Series 10: sagittal · sagittal · 0.25mm/px · 3 of 61 slices shown]
[im 21/61  brain]
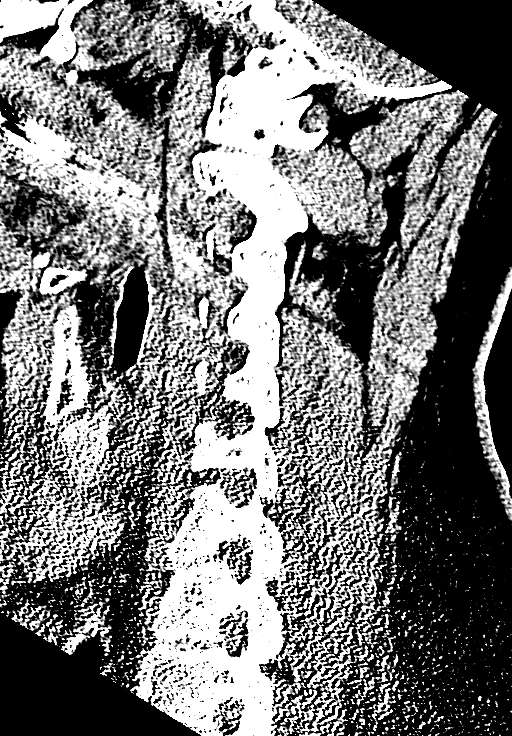
[im 31/61  brain]
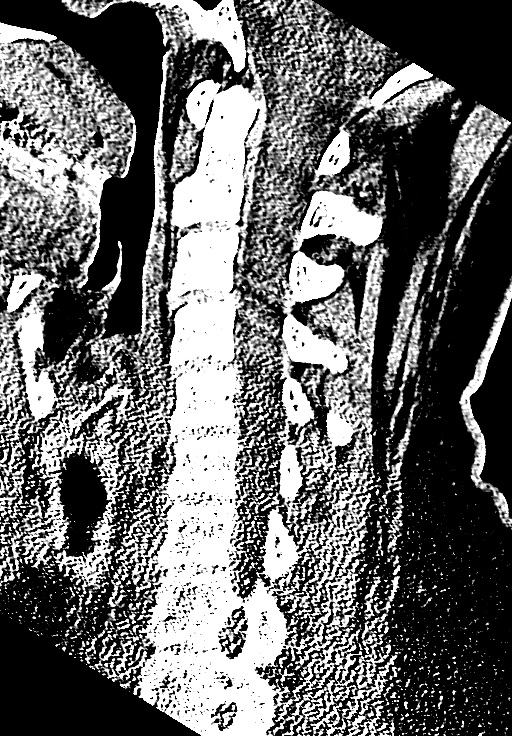
[im 41/61  brain]
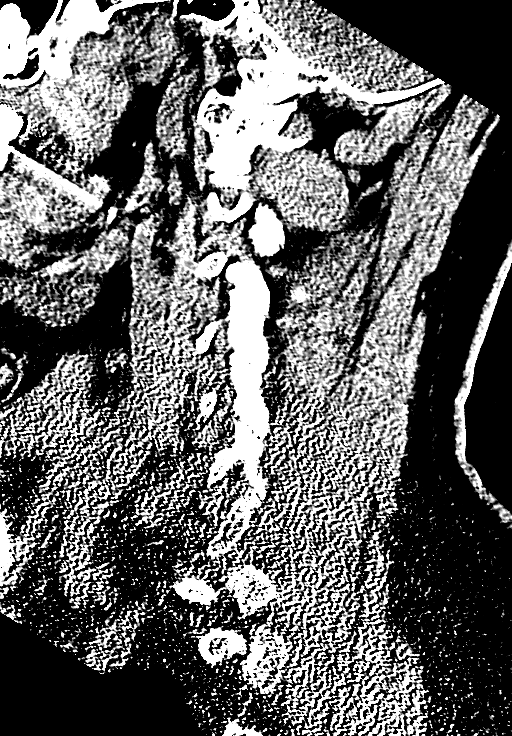

[14 of 47 positions shown; findings below may reference images not displayed]

FINDINGS: CT HEAD FINDINGS

The ventricles are normal in size and configuration. There is no
intracranial mass, hemorrhage, extra-axial fluid collection, or
midline shift. Gray-white compartments appear normal. No acute
infarct evident. The bony calvarium appears intact. The mastoid air
cells are clear. Orbits appear symmetric bilaterally. There is mild
mucosal thickening in several ethmoid air cells bilaterally.

CT CERVICAL SPINE FINDINGS

There is no fracture or spondylolisthesis. Prevertebral soft tissues
and predental space regions are normal. The disc spaces appear
normal. No nerve root edema or effacement. No disc extrusion or
stenosis.
IMPRESSION: CT head: Mild ethmoid sinus disease. No intracranial mass,
hemorrhage, or extra-axial fluid collection. Gray-white compartments
appear normal.

CT cervical spine: No fracture or spondylolisthesis. No disc
extrusion or stenosis. No nerve root edema or effacement.
